# Patient Record
Sex: Female | Born: 1970 | Race: White | Hispanic: No | Marital: Single | Smoking: Never smoker
Health system: Southern US, Community
[De-identification: ages and names within clinical notes are randomized; demographics above are authoritative.]

## PROBLEM LIST (undated history)

## (undated) DIAGNOSIS — J020 Streptococcal pharyngitis: Secondary | ICD-10-CM

## (undated) DIAGNOSIS — K219 Gastro-esophageal reflux disease without esophagitis: Secondary | ICD-10-CM

## (undated) HISTORY — PX: TUBAL LIGATION: SHX77

## (undated) HISTORY — PX: APPENDECTOMY: SHX54

---

## 1998-04-18 ENCOUNTER — Emergency Department (HOSPITAL_COMMUNITY): Admission: EM | Admit: 1998-04-18 | Discharge: 1998-04-18 | Payer: Self-pay | Admitting: Emergency Medicine

## 1999-11-13 ENCOUNTER — Emergency Department (HOSPITAL_COMMUNITY): Admission: EM | Admit: 1999-11-13 | Discharge: 1999-11-13 | Payer: Self-pay | Admitting: Emergency Medicine

## 2001-05-27 ENCOUNTER — Emergency Department (HOSPITAL_COMMUNITY): Admission: EM | Admit: 2001-05-27 | Discharge: 2001-05-27 | Payer: Self-pay

## 2001-08-09 ENCOUNTER — Emergency Department (HOSPITAL_COMMUNITY): Admission: EM | Admit: 2001-08-09 | Discharge: 2001-08-09 | Payer: Self-pay | Admitting: Emergency Medicine

## 2003-07-27 ENCOUNTER — Emergency Department (HOSPITAL_COMMUNITY): Admission: EM | Admit: 2003-07-27 | Discharge: 2003-07-27 | Payer: Self-pay | Admitting: Emergency Medicine

## 2003-07-29 ENCOUNTER — Emergency Department (HOSPITAL_COMMUNITY): Admission: AD | Admit: 2003-07-29 | Discharge: 2003-07-29 | Payer: Self-pay | Admitting: Family Medicine

## 2003-09-21 ENCOUNTER — Emergency Department (HOSPITAL_COMMUNITY): Admission: AD | Admit: 2003-09-21 | Discharge: 2003-09-21 | Payer: Self-pay | Admitting: Family Medicine

## 2006-03-02 ENCOUNTER — Emergency Department (HOSPITAL_COMMUNITY): Admission: EM | Admit: 2006-03-02 | Discharge: 2006-03-02 | Payer: Self-pay | Admitting: Family Medicine

## 2006-09-20 ENCOUNTER — Emergency Department (HOSPITAL_COMMUNITY): Admission: EM | Admit: 2006-09-20 | Discharge: 2006-09-20 | Payer: Self-pay | Admitting: Emergency Medicine

## 2006-12-02 ENCOUNTER — Emergency Department (HOSPITAL_COMMUNITY): Admission: EM | Admit: 2006-12-02 | Discharge: 2006-12-02 | Payer: Self-pay | Admitting: Emergency Medicine

## 2008-06-14 ENCOUNTER — Emergency Department (HOSPITAL_COMMUNITY): Admission: EM | Admit: 2008-06-14 | Discharge: 2008-06-14 | Payer: Self-pay | Admitting: Emergency Medicine

## 2008-09-03 ENCOUNTER — Emergency Department (HOSPITAL_COMMUNITY): Admission: EM | Admit: 2008-09-03 | Discharge: 2008-09-03 | Payer: Self-pay | Admitting: Emergency Medicine

## 2009-02-01 ENCOUNTER — Emergency Department (HOSPITAL_COMMUNITY): Admission: EM | Admit: 2009-02-01 | Discharge: 2009-02-01 | Payer: Self-pay | Admitting: Emergency Medicine

## 2009-04-03 ENCOUNTER — Emergency Department (HOSPITAL_COMMUNITY): Admission: EM | Admit: 2009-04-03 | Discharge: 2009-04-04 | Payer: Self-pay | Admitting: Emergency Medicine

## 2009-07-30 ENCOUNTER — Emergency Department (HOSPITAL_COMMUNITY): Admission: EM | Admit: 2009-07-30 | Discharge: 2009-07-30 | Payer: Self-pay | Admitting: Emergency Medicine

## 2009-10-28 ENCOUNTER — Emergency Department (HOSPITAL_COMMUNITY): Admission: EM | Admit: 2009-10-28 | Discharge: 2009-10-28 | Payer: Self-pay | Admitting: Family Medicine

## 2010-03-11 ENCOUNTER — Emergency Department (HOSPITAL_COMMUNITY): Admission: EM | Admit: 2010-03-11 | Discharge: 2010-03-11 | Payer: Self-pay | Admitting: Emergency Medicine

## 2010-04-10 ENCOUNTER — Emergency Department (HOSPITAL_COMMUNITY): Admission: EM | Admit: 2010-04-10 | Discharge: 2010-04-10 | Payer: Self-pay | Admitting: Family Medicine

## 2010-07-31 ENCOUNTER — Emergency Department (HOSPITAL_COMMUNITY)
Admission: EM | Admit: 2010-07-31 | Discharge: 2010-07-31 | Payer: Self-pay | Source: Home / Self Care | Admitting: Family Medicine

## 2010-08-03 LAB — WET PREP, GENITAL: Yeast Wet Prep HPF POC: NONE SEEN

## 2010-08-03 LAB — POCT URINALYSIS DIPSTICK
Bilirubin Urine: NEGATIVE
Ketones, ur: NEGATIVE mg/dL
Nitrite: NEGATIVE
Protein, ur: 30 mg/dL — AB
Specific Gravity, Urine: 1.02 (ref 1.005–1.030)
Urine Glucose, Fasting: NEGATIVE mg/dL
Urobilinogen, UA: 0.2 mg/dL (ref 0.0–1.0)
pH: 5 (ref 5.0–8.0)

## 2010-08-03 LAB — POCT PREGNANCY, URINE: Preg Test, Ur: NEGATIVE

## 2010-08-05 LAB — GC/CHLAMYDIA PROBE AMP, GENITAL
Chlamydia, DNA Probe: NEGATIVE
GC Probe Amp, Genital: NEGATIVE

## 2010-09-25 ENCOUNTER — Inpatient Hospital Stay (INDEPENDENT_AMBULATORY_CARE_PROVIDER_SITE_OTHER)
Admission: RE | Admit: 2010-09-25 | Discharge: 2010-09-25 | Disposition: A | Discharge status: Home / Self Care | Payer: Self-pay | Source: Ambulatory Visit | Attending: Family Medicine | Admitting: Family Medicine

## 2010-09-25 DIAGNOSIS — T192XXA Foreign body in vulva and vagina, initial encounter: Secondary | ICD-10-CM

## 2010-09-25 DIAGNOSIS — N760 Acute vaginitis: Secondary | ICD-10-CM

## 2010-09-25 LAB — POCT PREGNANCY, URINE: Preg Test, Ur: NEGATIVE

## 2010-09-25 LAB — POCT URINALYSIS DIPSTICK
Bilirubin Urine: NEGATIVE
Glucose, UA: NEGATIVE mg/dL
Hgb urine dipstick: NEGATIVE
Ketones, ur: NEGATIVE mg/dL
Nitrite: NEGATIVE
Protein, ur: NEGATIVE mg/dL
Specific Gravity, Urine: 1.01 (ref 1.005–1.030)
Urobilinogen, UA: 0.2 mg/dL (ref 0.0–1.0)
pH: 5.5 (ref 5.0–8.0)

## 2010-09-25 LAB — WET PREP, GENITAL
Trich, Wet Prep: NONE SEEN
Yeast Wet Prep HPF POC: NONE SEEN

## 2010-09-26 LAB — GC/CHLAMYDIA PROBE AMP, GENITAL
Chlamydia, DNA Probe: NEGATIVE
GC Probe Amp, Genital: NEGATIVE

## 2010-09-30 ENCOUNTER — Encounter: Payer: Self-pay | Admitting: Family Medicine

## 2010-10-01 LAB — POCT URINALYSIS DIPSTICK
Bilirubin Urine: NEGATIVE
Bilirubin Urine: NEGATIVE
Glucose, UA: NEGATIVE mg/dL
Glucose, UA: NEGATIVE mg/dL
Ketones, ur: NEGATIVE mg/dL
Ketones, ur: NEGATIVE mg/dL
Nitrite: NEGATIVE
Nitrite: NEGATIVE
Protein, ur: NEGATIVE mg/dL
Protein, ur: NEGATIVE mg/dL
Specific Gravity, Urine: <=1.005 (ref 1.005–1.030)
Specific Gravity, Urine: >=1.03 (ref 1.005–1.030)
Urobilinogen, UA: 0.2 mg/dL (ref 0.0–1.0)
Urobilinogen, UA: 0.2 mg/dL (ref 0.0–1.0)
pH: 5 (ref 5.0–8.0)
pH: 5 (ref 5.0–8.0)

## 2010-10-01 LAB — GC/CHLAMYDIA PROBE AMP, GENITAL
Chlamydia, DNA Probe: NEGATIVE
Chlamydia, DNA Probe: NEGATIVE
GC Probe Amp, Genital: NEGATIVE
GC Probe Amp, Genital: NEGATIVE

## 2010-10-01 LAB — URINE CULTURE
Colony Count: NO GROWTH
Culture  Setup Time: 201109231319
Culture: NO GROWTH

## 2010-10-01 LAB — WET PREP, GENITAL
Clue Cells Wet Prep HPF POC: NONE SEEN
Trich, Wet Prep: NONE SEEN
Yeast Wet Prep HPF POC: NONE SEEN

## 2010-10-01 LAB — POCT PREGNANCY, URINE: Preg Test, Ur: NEGATIVE

## 2010-10-03 LAB — POCT URINALYSIS DIP (DEVICE)
Bilirubin Urine: NEGATIVE
Glucose, UA: NEGATIVE mg/dL
Ketones, ur: NEGATIVE mg/dL
Nitrite: NEGATIVE
Protein, ur: NEGATIVE mg/dL
Specific Gravity, Urine: 1.01 (ref 1.005–1.030)
Urobilinogen, UA: 1 mg/dL (ref 0.0–1.0)
pH: 6 (ref 5.0–8.0)

## 2010-10-03 LAB — POCT PREGNANCY, URINE: Preg Test, Ur: NEGATIVE

## 2010-10-03 LAB — GC/CHLAMYDIA PROBE AMP, GENITAL
Chlamydia, DNA Probe: NEGATIVE
GC Probe Amp, Genital: NEGATIVE

## 2010-10-03 LAB — WET PREP, GENITAL
Trich, Wet Prep: NONE SEEN
Yeast Wet Prep HPF POC: NONE SEEN

## 2010-10-23 LAB — RPR: RPR Ser Ql: NONREACTIVE

## 2010-10-23 LAB — WET PREP, GENITAL: Yeast Wet Prep HPF POC: NONE SEEN

## 2010-10-23 LAB — URINALYSIS, ROUTINE W REFLEX MICROSCOPIC
Glucose, UA: NEGATIVE mg/dL
Ketones, ur: NEGATIVE mg/dL
Nitrite: NEGATIVE
Protein, ur: NEGATIVE mg/dL
Specific Gravity, Urine: 1.028 (ref 1.005–1.030)
Urobilinogen, UA: 1 mg/dL (ref 0.0–1.0)
pH: 5.5 (ref 5.0–8.0)

## 2010-10-23 LAB — GC/CHLAMYDIA PROBE AMP, GENITAL
Chlamydia, DNA Probe: NEGATIVE
GC Probe Amp, Genital: NEGATIVE

## 2010-10-23 LAB — HIV ANTIBODY (ROUTINE TESTING W REFLEX): HIV: NONREACTIVE

## 2010-10-23 LAB — URINE MICROSCOPIC-ADD ON

## 2010-10-23 LAB — PREGNANCY, URINE: Preg Test, Ur: NEGATIVE

## 2010-10-25 LAB — WET PREP, GENITAL

## 2010-10-25 LAB — GC/CHLAMYDIA PROBE AMP, GENITAL: GC Probe Amp, Genital: NEGATIVE

## 2010-11-03 LAB — POCT RAPID STREP A (OFFICE): Streptococcus, Group A Screen (Direct): POSITIVE — AB

## 2011-03-30 ENCOUNTER — Ambulatory Visit: Payer: Self-pay

## 2011-03-30 ENCOUNTER — Other Ambulatory Visit: Payer: Self-pay | Admitting: Obstetrics and Gynecology

## 2011-03-30 ENCOUNTER — Encounter: Payer: Self-pay | Admitting: Obstetrics and Gynecology

## 2011-03-30 ENCOUNTER — Ambulatory Visit (HOSPITAL_COMMUNITY): Payer: Self-pay

## 2011-03-30 DIAGNOSIS — Z1231 Encounter for screening mammogram for malignant neoplasm of breast: Secondary | ICD-10-CM

## 2011-04-01 ENCOUNTER — Ambulatory Visit (INDEPENDENT_AMBULATORY_CARE_PROVIDER_SITE_OTHER): Payer: Self-pay

## 2011-04-01 ENCOUNTER — Inpatient Hospital Stay (INDEPENDENT_AMBULATORY_CARE_PROVIDER_SITE_OTHER)
Admission: RE | Admit: 2011-04-01 | Discharge: 2011-04-01 | Disposition: A | Discharge status: Home / Self Care | Payer: Self-pay | Source: Ambulatory Visit | Attending: Family Medicine | Admitting: Family Medicine

## 2011-04-01 DIAGNOSIS — M7040 Prepatellar bursitis, unspecified knee: Secondary | ICD-10-CM

## 2011-04-12 ENCOUNTER — Other Ambulatory Visit: Payer: Self-pay | Admitting: Obstetrics and Gynecology

## 2011-04-12 DIAGNOSIS — Z1231 Encounter for screening mammogram for malignant neoplasm of breast: Secondary | ICD-10-CM

## 2011-04-13 ENCOUNTER — Ambulatory Visit: Payer: Self-pay

## 2011-04-13 ENCOUNTER — Ambulatory Visit (HOSPITAL_COMMUNITY)
Admission: RE | Admit: 2011-04-13 | Discharge: 2011-04-13 | Disposition: A | Discharge status: Home / Self Care | Payer: Self-pay | Source: Ambulatory Visit | Attending: Obstetrics and Gynecology | Admitting: Obstetrics and Gynecology

## 2011-04-13 DIAGNOSIS — Z1231 Encounter for screening mammogram for malignant neoplasm of breast: Secondary | ICD-10-CM

## 2011-12-11 ENCOUNTER — Encounter (HOSPITAL_COMMUNITY): Payer: Self-pay | Admitting: *Deleted

## 2011-12-11 ENCOUNTER — Emergency Department (INDEPENDENT_AMBULATORY_CARE_PROVIDER_SITE_OTHER)
Admission: EM | Admit: 2011-12-11 | Discharge: 2011-12-11 | Disposition: A | Discharge status: Home / Self Care | Payer: Self-pay | Source: Home / Self Care | Attending: Emergency Medicine | Admitting: Emergency Medicine

## 2011-12-11 DIAGNOSIS — K219 Gastro-esophageal reflux disease without esophagitis: Secondary | ICD-10-CM

## 2011-12-11 DIAGNOSIS — R0989 Other specified symptoms and signs involving the circulatory and respiratory systems: Secondary | ICD-10-CM

## 2011-12-11 DIAGNOSIS — F458 Other somatoform disorders: Secondary | ICD-10-CM

## 2011-12-11 DIAGNOSIS — J040 Acute laryngitis: Secondary | ICD-10-CM

## 2011-12-11 HISTORY — DX: Gastro-esophageal reflux disease without esophagitis: K21.9

## 2011-12-11 HISTORY — DX: Streptococcal pharyngitis: J02.0

## 2011-12-11 LAB — POCT RAPID STREP A: Streptococcus, Group A Screen (Direct): NEGATIVE

## 2011-12-11 MED ORDER — OMEPRAZOLE 20 MG PO CPDR: 20.0000 mg | DELAYED_RELEASE_CAPSULE | Freq: Every day | ORAL | Status: DC

## 2011-12-11 MED ORDER — FAMOTIDINE 20 MG PO TABS: 20.0000 mg | ORAL_TABLET | Freq: Two times a day (BID) | ORAL | Status: DC

## 2011-12-11 NOTE — ED Provider Notes (Signed)
History     CSN: [id number]  Arrival date & time 12/11/11  1247   First MD Initiated Contact with Patient 12/11/11 1359      Chief Complaint  Patient presents with  . Lymphadenopathy  . Dysphagia    (Consider location/radiation/quality/duration/timing/severity/associated sxs/prior treatment) HPI Comments: Patient reports a sensation of swelling in her anterior throat for the past week. She denies any pain, fevers, drooling, trismus. No ear pain, nasal congestion, sinus pain, postnasal drip. No voice changes, difficulty breathing. States she is able to swallow both solids and liquids, but it is more difficult to swallow solids than normal. No aggravating or alleviating factors. She's not tried anything for her symptoms. No nausea, vomiting, fevers. No dental pain. Facial swelling. no coughing, wheezing, chest pain, shortness of breath. No abdominal pain, chest tightness is similar to previous episodes of reflux. Patient states reflux is not bothering her currently. Patient has never been a smoker.   ROS as noted in HPI. All other ROS negative.   Patient is a 41 y.o. female presenting with pharyngitis. The history is provided by the patient. No language interpreter was used.  Sore Throat This is a new problem. The current episode started more than 1 week ago. The problem occurs constantly. The problem has not changed since onset.Pertinent negatives include no chest pain, no abdominal pain, no headaches and no shortness of breath. The symptoms are aggravated by nothing. The symptoms are relieved by nothing. She has tried nothing for the symptoms. The treatment provided no relief.    Past Medical History  Diagnosis Date  . GERD (gastroesophageal reflux disease)   . Strep pharyngitis     Past Surgical History  Procedure Date  . Appendectomy   . Tubal ligation     History reviewed. No pertinent family history.  History  Substance Use Topics  . Smoking status: Never Smoker   .  Smokeless tobacco: Not on file  . Alcohol Use: Yes    OB History    Grav Para Term Preterm Abortions TAB SAB Ect Mult Living                  Review of Systems  Respiratory: Negative for shortness of breath.   Cardiovascular: Negative for chest pain.  Gastrointestinal: Negative for abdominal pain.  Neurological: Negative for headaches.    Allergies  Review of patient's allergies indicates no known allergies.  Home Medications   Current Outpatient Rx  Name Route Sig Dispense Refill  . FAMOTIDINE 20 MG PO TABS Oral Take 1 tablet (20 mg total) by mouth 2 (two) times daily. 60 tablet 0  . OMEPRAZOLE 20 MG PO CPDR Oral Take 1 capsule (20 mg total) by mouth daily. 30 capsule 0    BP 123/84  Pulse 74  Temp(Src) 96.9 F (36.1 C) (Oral)  Resp 16  SpO2 96%  LMP 12/07/2011  Physical Exam  Nursing note and vitals reviewed. Constitutional: She is oriented to person, place, and time. She appears well-developed and well-nourished. No distress.  HENT:  Head: Normocephalic and atraumatic. No trismus in the jaw.  Right Ear: Tympanic membrane normal.  Left Ear: Tympanic membrane normal.  Nose: Nose normal.  Mouth/Throat: Uvula is midline and mucous membranes are normal. Abnormal dentition. No dental abscesses, uvula swelling or dental caries. No oropharyngeal exudate or tonsillar abscesses.       Poor dentition, no tenderness, gingival swelling. No facial swelling. No palpable salivary stones. Enlarged tonsils, no exudates. No stridor  Eyes: Conjunctivae  and EOM are normal.  Neck: Normal range of motion. Neck supple. Normal range of motion present. No thyromegaly present.  Cardiovascular: Normal rate.   Pulmonary/Chest: Effort normal. No stridor.  Abdominal: She exhibits no distension.  Musculoskeletal: Normal range of motion.  Lymphadenopathy:    She has no cervical adenopathy.  Neurological: She is alert and oriented to person, place, and time.  Skin: Skin is warm and dry.    Psychiatric: She has a normal mood and affect. Her behavior is normal. Judgment and thought content normal.    ED Course  Procedures (including critical care time)   Labs Reviewed  POCT RAPID STREP A ([facility] ONLY)   No results found.   1. Reflux laryngitis   2. Globus sensation    Results for orders placed during the hospital encounter of 12/11/11  POCT RAPID STREP A ([facility] ONLY)      Component Value Range   Streptococcus, Group A Screen (Direct) NEGATIVE  NEGATIVE     MDM   airway widely patent, no drooling, trismus, stridor. Patient with history of reflux,  presentation most consistent with this. Will start her on H2 and PPI. Will have her follow with Dr. Pollyann Kennedy, ENT on call, in a week if she does not improve with this for laryngoscopy.   Luiz Blare, MD 12/11/11 [phone]

## 2011-12-11 NOTE — Discharge Instructions (Signed)
this sensation is most commonly due to reflux. We will start you on a PPI and H2 blocker which will treat reflux. Elevate head of your bed, decreased spicy foods, chocolate, alcohol before going to bed. If this is not get better in a week, you'll need to see ear nose throat specialist, to have her examined with a scope. Return for fever above 100.4, if you cannot breathe,  cannot swallow, for changes, or any other concerns.

## 2011-12-11 NOTE — ED Notes (Signed)
EMT briefly evaluated this pt in waiting area due to c/o swollen throat. Pt reports that she has been having difficulty swallowing (indicated area proximal to epiglottis on throat with her finger) for several days. Pt reports no difficulty breathing and speaks in complete sentences.

## 2011-12-11 NOTE — ED Notes (Signed)
Pt with c/o feeling her throat is swollen and difficulty swallowing x one week - denies cough/congestion/fever or sore throat - able to eat and drink without difficulty

## 2013-11-21 ENCOUNTER — Emergency Department (HOSPITAL_COMMUNITY)
Admission: EM | Admit: 2013-11-21 | Discharge: 2013-11-21 | Disposition: A | Discharge status: Home / Self Care | Payer: Self-pay | Attending: Emergency Medicine | Admitting: Emergency Medicine

## 2013-11-21 ENCOUNTER — Emergency Department (HOSPITAL_COMMUNITY): Payer: Self-pay

## 2013-11-21 ENCOUNTER — Encounter (HOSPITAL_COMMUNITY): Payer: Self-pay | Admitting: Emergency Medicine

## 2013-11-21 DIAGNOSIS — R112 Nausea with vomiting, unspecified: Secondary | ICD-10-CM | POA: Insufficient documentation

## 2013-11-21 DIAGNOSIS — Z3202 Encounter for pregnancy test, result negative: Secondary | ICD-10-CM | POA: Insufficient documentation

## 2013-11-21 DIAGNOSIS — Z79899 Other long term (current) drug therapy: Secondary | ICD-10-CM | POA: Insufficient documentation

## 2013-11-21 DIAGNOSIS — Z9851 Tubal ligation status: Secondary | ICD-10-CM | POA: Insufficient documentation

## 2013-11-21 DIAGNOSIS — N390 Urinary tract infection, site not specified: Secondary | ICD-10-CM | POA: Insufficient documentation

## 2013-11-21 DIAGNOSIS — Z9889 Other specified postprocedural states: Secondary | ICD-10-CM | POA: Insufficient documentation

## 2013-11-21 DIAGNOSIS — K219 Gastro-esophageal reflux disease without esophagitis: Secondary | ICD-10-CM | POA: Insufficient documentation

## 2013-11-21 DIAGNOSIS — Z8619 Personal history of other infectious and parasitic diseases: Secondary | ICD-10-CM | POA: Insufficient documentation

## 2013-11-21 LAB — CBC
HCT: 40.2 % (ref 36.0–46.0)
Hemoglobin: 13.7 g/dL (ref 12.0–15.0)
MCH: 30 pg (ref 26.0–34.0)
MCHC: 34.1 g/dL (ref 30.0–36.0)
MCV: 88 fL (ref 78.0–100.0)
Platelets: 276 10*3/uL (ref 150–400)
RBC: 4.57 MIL/uL (ref 3.87–5.11)
RDW: 13.4 % (ref 11.5–15.5)
WBC: 8.6 10*3/uL (ref 4.0–10.5)

## 2013-11-21 LAB — URINALYSIS, ROUTINE W REFLEX MICROSCOPIC
Bilirubin Urine: NEGATIVE
Glucose, UA: NEGATIVE mg/dL
Ketones, ur: >80 mg/dL — AB
Nitrite: POSITIVE — AB
Protein, ur: 30 mg/dL — AB
Specific Gravity, Urine: 1.029 (ref 1.005–1.030)
Urobilinogen, UA: 0.2 mg/dL (ref 0.0–1.0)
pH: 5 (ref 5.0–8.0)

## 2013-11-21 LAB — COMPREHENSIVE METABOLIC PANEL WITH GFR
ALT: 11 U/L (ref 0–35)
AST: 14 U/L (ref 0–37)
Albumin: 4.2 g/dL (ref 3.5–5.2)
Alkaline Phosphatase: 85 U/L (ref 39–117)
BUN: 9 mg/dL (ref 6–23)
CO2: 25 meq/L (ref 19–32)
Calcium: 9.7 mg/dL (ref 8.4–10.5)
Chloride: 105 meq/L (ref 96–112)
Creatinine, Ser: 0.65 mg/dL (ref 0.50–1.10)
GFR calc Af Amer: >90 mL/min
GFR calc non Af Amer: >90 mL/min
Glucose, Bld: 116 mg/dL — ABNORMAL HIGH (ref 70–99)
Potassium: 4.5 meq/L (ref 3.7–5.3)
Sodium: 142 meq/L (ref 137–147)
Total Bilirubin: 0.5 mg/dL (ref 0.3–1.2)
Total Protein: 8.4 g/dL — ABNORMAL HIGH (ref 6.0–8.3)

## 2013-11-21 LAB — URINE MICROSCOPIC-ADD ON

## 2013-11-21 LAB — I-STAT TROPONIN, ED: Troponin i, poc: 0.02 ng/mL (ref 0.00–0.08)

## 2013-11-21 LAB — D-DIMER, QUANTITATIVE: D-Dimer, Quant: 0.31 ug/mL-FEU (ref 0.00–0.48)

## 2013-11-21 LAB — POC URINE PREG, ED: Preg Test, Ur: NEGATIVE

## 2013-11-21 LAB — TROPONIN I

## 2013-11-21 LAB — LIPASE, BLOOD: Lipase: 30 U/L (ref 11–59)

## 2013-11-21 MED ORDER — ONDANSETRON 4 MG PO TBDP
4.0000 mg | ORAL_TABLET | Freq: Once | ORAL | Status: AC
  Administered 2013-11-21: 4 mg via ORAL
  Filled 2013-11-21: qty 1

## 2013-11-21 MED ORDER — OMEPRAZOLE 20 MG PO CPDR: 20.0000 mg | DELAYED_RELEASE_CAPSULE | Freq: Every day | ORAL | Status: DC

## 2013-11-21 MED ORDER — SODIUM CHLORIDE 0.9 % IV BOLUS (SEPSIS)
1000.0000 mL | Freq: Once | INTRAVENOUS | Status: AC
  Administered 2013-11-21: 1000 mL via INTRAVENOUS

## 2013-11-21 MED ORDER — DEXTROSE 5 % IV SOLN
1.0000 g | Freq: Once | INTRAVENOUS | Status: AC
  Administered 2013-11-21: 1 g via INTRAVENOUS
  Filled 2013-11-21: qty 10

## 2013-11-21 MED ORDER — GI COCKTAIL ~~LOC~~
30.0000 mL | Freq: Once | ORAL | Status: AC
  Administered 2013-11-21: 30 mL via ORAL
  Filled 2013-11-21: qty 30

## 2013-11-21 MED ORDER — ONDANSETRON HCL 4 MG PO TABS: 4.0000 mg | ORAL_TABLET | Freq: Four times a day (QID) | ORAL | Status: DC

## 2013-11-21 MED ORDER — OXYCODONE-ACETAMINOPHEN 5-325 MG PO TABS
1.0000 | ORAL_TABLET | Freq: Once | ORAL | Status: AC
  Administered 2013-11-21: 1 via ORAL
  Filled 2013-11-21: qty 1

## 2013-11-21 MED ORDER — CEPHALEXIN 500 MG PO CAPS: 500.0000 mg | ORAL_CAPSULE | Freq: Two times a day (BID) | ORAL | Status: DC

## 2013-11-21 MED ORDER — FAMOTIDINE 20 MG PO TABS: 20.0000 mg | ORAL_TABLET | Freq: Two times a day (BID) | ORAL | Status: DC

## 2013-11-21 NOTE — ED Notes (Signed)
Pt did not answer when called for reassessment of vitals.

## 2013-11-21 NOTE — Discharge Instructions (Signed)
Please call your doctor for a followup appointment within 24-48 hours. When you talk to your doctor please let them know that you were seen in the emergency department and have them acquire all of your records so that they can discuss the findings with you and formulate a treatment plan to fully care for your new and ongoing problems. Please call and set up an appointment with health and wellness Center Please call and set up an appointment with gastroenterologist regarding word symptoms Please rest and stay hydrated-please drink plenty of water Please stay away from foods high in fat, greasy, spicy, acidity for this can worsen symptoms of GERD Please take medications as prescribed Please take Keflex as prescribed for urinary tract infection Please continue to monitor symptoms closely if symptoms are to worsen or change (fever greater than 101, chills, chest pain, shortness of breath, difficulty breathing, neck pain, neck stiffness, abdominal pain, nausea, vomiting, diarrhea, blood in the stools, black tarry stools, worsening symptoms) please report back to the ED immediately  Urinary Tract Infection Urinary tract infections (UTIs) can develop anywhere along your urinary tract. Your urinary tract is your body's drainage system for removing wastes and extra water. Your urinary tract includes two kidneys, two ureters, a bladder, and a urethra. Your kidneys are a pair of bean-shaped organs. Each kidney is about the size of your fist. They are located below your ribs, one on each side of your spine. CAUSES Infections are caused by microbes, which are microscopic organisms, including fungi, viruses, and bacteria. These organisms are so small that they can only be seen through a microscope. Bacteria are the microbes that most commonly cause UTIs. SYMPTOMS  Symptoms of UTIs may vary by age and gender of the patient and by the location of the infection. Symptoms in young women typically include a frequent and  intense urge to urinate and a painful, burning feeling in the bladder or urethra during urination. Older women and men are more likely to be tired, shaky, and weak and have muscle aches and abdominal pain. A fever may mean the infection is in your kidneys. Other symptoms of a kidney infection include pain in your back or sides below the ribs, nausea, and vomiting. DIAGNOSIS To diagnose a UTI, your caregiver will ask you about your symptoms. Your caregiver also will ask to provide a urine sample. The urine sample will be tested for bacteria and white blood cells. White blood cells are made by your body to help fight infection. TREATMENT  Typically, UTIs can be treated with medication. Because most UTIs are caused by a bacterial infection, they usually can be treated with the use of antibiotics. The choice of antibiotic and length of treatment depend on your symptoms and the type of bacteria causing your infection. HOME CARE INSTRUCTIONS  If you were prescribed antibiotics, take them exactly as your caregiver instructs you. Finish the medication even if you feel better after you have only taken some of the medication.  Drink enough water and fluids to keep your urine clear or pale yellow.  Avoid caffeine, tea, and carbonated beverages. They tend to irritate your bladder.  Empty your bladder often. Avoid holding urine for long periods of time.  Empty your bladder before and after sexual intercourse.  After a bowel movement, women should cleanse from front to back. Use each tissue only once. SEEK MEDICAL CARE IF:   You have back pain.  You develop a fever.  Your symptoms do not begin to resolve within  3 days. SEEK IMMEDIATE MEDICAL CARE IF:   You have severe back pain or lower abdominal pain.  You develop chills.  You have nausea or vomiting.  You have continued burning or discomfort with urination. MAKE SURE YOU:   Understand these instructions.  Will watch your condition.  Will  get help right away if you are not doing well or get worse. Document Released: 04/14/2005 Document Revised: 01/04/2012 Document Reviewed: 08/13/2011 [facility] Patient Information 2014 [place]Care, [place].  Diet for Gastroesophageal Reflux Disease, Adult Reflux (acid reflux) is when acid from your stomach flows up into the esophagus. When acid comes in contact with the esophagus, the acid causes irritation and soreness (inflammation) in the esophagus. When reflux happens often or so severely that it causes damage to the esophagus, it is called gastroesophageal reflux disease (GERD). Nutrition therapy can help ease the discomfort of GERD. FOODS OR DRINKS TO AVOID OR LIMIT  Smoking or chewing tobacco. Nicotine is one of the most potent stimulants to acid production in the gastrointestinal tract.  Caffeinated and decaffeinated coffee and black tea.  Regular or low-calorie carbonated beverages or energy drinks (caffeine-free carbonated beverages are allowed).   Strong spices, such as black pepper, white pepper, red pepper, cayenne, curry powder, and chili powder.  Peppermint or spearmint.  Chocolate.  High-fat foods, including meats and fried foods. Extra added fats including oils, butter, salad dressings, and nuts. Limit these to less than 8 tsp per day.  Fruits and vegetables if they are not tolerated, such as citrus fruits or tomatoes.  Alcohol.  Any food that seems to aggravate your condition. If you have questions regarding your diet, call your caregiver or a registered dietitian. OTHER THINGS THAT MAY HELP GERD INCLUDE:   Eating your meals slowly, in a relaxed setting.  Eating 5 to 6 small meals per day instead of 3 large meals.  Eliminating food for a period of time if it causes distress.  Not lying down until 3 hours after eating a meal.  Keeping the head of your bed raised 6 to 9 inches (15 to 23 cm) by using a foam wedge or blocks under the legs of the bed. Lying flat may make  symptoms worse.  Being physically active. Weight loss may be helpful in reducing reflux in overweight or obese adults.  Wear loose fitting clothing EXAMPLE MEAL PLAN This meal plan is approximately 2,000 calories based on https://www.bernard.org/ChooseMyPlate.gov meal planning guidelines. Breakfast   cup cooked oatmeal.  1 cup strawberries.  1 cup low-fat milk.  1 oz almonds. Snack  1 cup cucumber slices.  6 oz yogurt (made from low-fat or fat-free milk). Lunch  2 slice whole-wheat bread.  2 oz sliced [place]urkey.  2 tsp mayonnaise.  1 cup blueberries.  1 cup snap peas. Snack  6 whole-wheat crackers.  1 oz string cheese. Dinner   cup brown rice.  1 cup mixed veggies.  1 tsp olive oil.  3 oz grilled fish. Document Released: 07/05/2005 Document Revised: 09/27/2011 Document Reviewed: 05/21/2011 [facility] TexasExitCare Patient Information 2014 [place] PassExitCare, [place]LLC.   Emergency Department Resource Guide 1) Find a Doctor and Pay Out of Pocket Although you won't have to find out who is covered by your insurance plan, it is a good idea to ask around and get recommendations. You will then need to call the office and see if the doctor you have chosen will accept you as a new patient and what types of options they offer for patients who are self-pay. Some doctors  offer discounts or will set up payment plans for their patients who do not have insurance, but you will need to ask so you aren't surprised when you get to your appointment.  2) Contact Your Local Health Department Not all health departments have doctors that can see patients for sick visits, but many do, so it is worth a call to see if yours does. If you don't know where your local health department is, you can check in your phone book. The CDC also has a tool to help you locate your state's health department, and many state websites also have listings of all of their local health departments.  3) Find a Walk-in Clinic If your illness is not likely to be  very severe or complicated, you may want to try a walk in clinic. These are popping up all over the country in pharmacies, drugstores, and shopping centers. They're usually staffed by nurse practitioners or physician assistants that have been trained to treat common illnesses and complaints. They're usually fairly quick and inexpensive. However, if you have serious medical issues or chronic medical problems, these are probably not your best option.  No Primary Care Doctor: - Call Health Connect at  [phone] - they can help you locate a primary care doctor that  accepts your insurance, provides certain services, etc. - Physician Referral Service- [phone]  Chronic Pain Problems: Organization         Address  Phone   Notes  [facility]  [phone] Patients need to be referred by their primary care doctor.   Medication Assistance: Organization         Address  Phone   Notes  [facility] Medication [facility] [address] [place] [place], [place] [address] [phone] --Must be a resident of [facility] -- Must have NO insurance coverage whatsoever (no Medicaid/ Medicare, etc.) -- The pt. MUST have a primary care doctor that directs their care regularly and follows them in the community   [facility]ist  [phone]   [employer]  [phone]    Agencies that provide inexpensive medical care: Organization         Address  Phone   Notes  [facility]  [phone]   [facility]    [phone]   [facility] [address] [place], [place] [address] [phone]   [facility] [address] [place] [place] [phone]   Planned Parenthood    [phone]   [facility]    [phone]   [facility]  [address], [place] Phone:  [phone], Fax:  [phone] Hours of Operation:  9 am - 6 pm, M-F.  Also  accepts Medicaid/Medicare and self-pay.  [facility]  [address], [place] Phone: [phone], Fax: [phone]. Hours of Operation:  8:30 am - 5:30 pm, M-F.  Also accepts Medicaid and self-pay.  [facility] [place] [address], [place] Phone: [phone]   [facility] [address] Natasha Bence [place], [place] [phone], Ext. [phone] Mondays & Thursdays: 7-9 AM.  First 15 patients are seen on a first come, first serve basis.    Medicaid-accepting [facility] Providers:  Organization         Address  Phone   Notes  [facility] [address], Laurell Josephs  A, [place] [phone] Also accepts self-pay patients.  [facility] [address], [place]  [phone]   [facility] [address], [place] [phone]   [facility] [address] [place] [address], [place] [phone]) [phone]   Renaye RakersVeita Bland [address], [place]   [phone] Only accepts [place] Access [place]icaid patients after they have their name applied to their card.   Self-Pay (no insurance) in [facility]:  Organization         Address  Phone   Notes  Sickle Cell Patients, [facility] [address], [place] [phone](336) [phone]   [facility] [address], [place] [phone](336) [phone]   [facility] [place]  [address], [place] [phone]   [facility]/Dr. Osei-Bonsu  [address] [place] [address], [place] or [phone], [place] [phone] Phone number for both [place] and [place] locations is the same.  [facility] and [facility] [address], [place] [phone]   [facility] [place] [address] [place] [address], [place] or [address] [phone]   [facility] [address],  [place] [phone](336) [phone], phone; [phone], fax Sees patients 1st and 3rd Saturday of every month.  Must not qualify for public or private insurance (i.e. Medicaid, Medicare, [place] Health Choice, Veterans' Benefits)  Household income should be no more than 200% of the poverty level The clinic cannot treat you if you are pregnant or think you are pregnant  Sexually transmitted diseases are not treated at the clinic.    Dental Care: Organization         Address  Phone  Notes  [facility] [address], [place] [phone] Accepts children up to age 44 who are enrolled in [place]icaid or [place] Health Choice; pregnant women with a Medicaid card; and children who have applied for Medicaid or [place] Health Choice, but were declined, whose parents can pay a reduced fee at time of service.  [facility] of [facility] [place]  [address], [place] [phone] Accepts children up to age 44 who are enrolled in [place]icaid or [address] Health Choice; pregnant women with a Medicaid card; and children who have applied for Medicaid or [place] Health Choice, but were declined, whose parents can pay a reduced fee at time of service.  [facility]  [address], [place] [phone]) [phone] Patients are seen by appointment only. Walk-ins are not accepted. [facility] will see patients 44 years of age and older. Monday - Tuesday (8am-5pm) Most Wednesdays (8:30-5pm) $30 per visit, cash only  [facility]  [address], [facility] [phone]) [phone] Patients are seen by appointment only. Walk-ins are not accepted. [facility] will see patients 44 years of age and older. One Wednesday Evening (Monthly: Volunteer Based).  $30 per visit, cash only  [employer] [facility]  [phone] for adults; Children under age 614, call Graduate Pediatric Dentistry at [phone].  Children aged 624-14, please call [phone] to request a pediatric application.  Dental services are provided in all areas of dental care including fillings, crowns and bridges, complete and partial dentures, implants, gum treatment, root canals, and extractions. Preventive care  is also provided. Treatment is provided to both adults and children. Patients are selected via a lottery and there is often a waiting list.   [facility] [address], [place]  [phone]) [phone] [email or web address]   [facility] [address], [place], [place] [phone], Ext. [phone] Second and Fourth Thursday of each month, opens at 6:30 AM; Clinic ends at 9 AM.  Patients are seen on a first-come first-served basis, and a limited number are seen during each clinic.   [facility]  [address], [place], [place] [phone]) [phone]   Eligibility Requirements You must have lived in [place], [place], or [place] counties for at least the last three months.   You cannot be eligible for state or federal sponsored National Cityhealthcare insurance, including CIGNAVeterans Administration, [place]Medicaid, or Har[employer] EntertainmentMedicare.   You generally cannot be eligible for healthcare insurance through your employer.    How to apply: Eligibility screenings are held every Tuesday and Wednesday afternoon from 1:00 pm until 4:00 pm. You do not need an appointment for the interview!  [facility] [address], [place], [place] [phone]   [facility]  [phone]   [facility]  [phone]   [facility]  [phone]    Behavioral Health Resources in the Community: Intensive Outpatient Programs Organization         Address  Phone  Notes  [facility] [address], [place], [place] [phone]   [facility] [address], [place], [place] [phone]   [facility] [address], [place], [place]  [phone]   [facility] [address],  [place], [place] [phone] or [phone]   Substance Abuse Resources Organization         Address  Phone  Notes  [facility] Drug [facility]  [phone]   [facility]  [phone]   [facility]  [phone]   [facility]  [phone]   Residential & Outpatient Substance Abuse Program  [phone]   Psychological Services Organization         Address  Phone  Notes  [facility]  [phone]- [phone]   [facility]  [phone]- [phone]   [facility] [address], [place] [phone] or [phone]    Mobile Crisis Teams Organization         Address  Phone  Notes  [facility], Mobile Crisis Care Unit  [phone]   [facility]  [address] [place], [place] [phone]   Doristine LocksSharon DeEsch [address] [place] [phone]    Self-Help/Support Groups Organization         Address  Phone             Notes  [facility] [place] - variety of support groups  336- [phone] Call for more information  Narcotics Anonymous (NA), [facility] [address], [place]  2 meetings at this location   Statisticianesidential Treatment Programs Organization         Address  Phone  Notes  [facility] [address],    [place]  [phone]   [facility]  [address], [place] [phone], [place], [place] [phone]   [facility] [address], [place] [phone] Admissions: 8am-3pm M-F  [facility] [address]  [place], [place] [phone]   [facility] [address] Starling Manns [place], [place] [phone]   [facility] [address]  [place], [place] [phone]   [facility] - Intensive Outpatient [address], [place], [place] [phone]     [facility] ([facility] [address] [place].,  [place], [place] [phone] or [phone]   [facility] ([facility]) [address], [place], [place] [phone] Accepts Medicaid  [facility] [address],  [place] [phone] Substance Abuse/Addiction Treatment   [facility] Organization         Address  Phone  Notes  [facility]  [phone]   Angie Fava, PhD [address] Ervin Knack [place], [place]   [phone] or [phone]   [facility]   [address] [place], [place] [phone]   [facility] [address] [place], [place] [phone] Insurance/Medicaid/sponsorship through [facility] [address]                                    [place], [place] [phone] Therapy/tele-psych/case  [facility] [address], [place] [phone]    Dr. Lolly Mustache  [phone]   [facility] [place]  [employer] [facility] Dept. 1) [address], [place] 2) [address], [place] 3)  [address], [place] [phone]  [phone]   [facility] Child Abuse Hotline [phone] or [phone] (After Hours)

## 2013-11-21 NOTE — ED Notes (Signed)
Pt tolerating oral fluids and cracker. 

## 2013-11-21 NOTE — ED Provider Notes (Signed)
CSN: [id number]     Arrival date & time 11/21/13  1243 History   First MD Initiated Contact with Patient 11/21/13 1624     Chief Complaint  Patient presents with  . Chest Pain     (Consider location/radiation/quality/duration/timing/severity/associated sxs/prior Treatment) The history is provided by the patient. No language interpreter was used.  Danielle Frost is a 43 year old female with past history of GERD, streptococcal pharyngitis presenting to the ED with chest pain that started this morning with the sudden onset at 2:00 AM that woke patient up out of a sound sleep. Reported that the pain is localized to the center the chest described as a constant pressure, squeezing sensation with mild radiation towards the back. Patient reports that she's taken nothing for the pain. Reported that she's been feeling extremely nauseous with numerous episodes of vomiting-reported that she cannot recall exactly way how many episodes of emesis she has had today. Reported that she is unable to keep any food or fluids down. Reported these 3 bowel movements today-normal. Patient reported that she ate french fries and a hamburger yesterday. Denied weakness, neck pain, abdominal pain, diarrhea, melena, hematochezia, fainting, blurred vision, sudden loss of vision. Denied cigarette and alcohol use. PCP negative  Past Medical History  Diagnosis Date  . GERD (gastroesophageal reflux disease)   . Strep pharyngitis    Past Surgical History  Procedure Laterality Date  . Appendectomy    . Tubal ligation     History reviewed. No pertinent family history. History  Substance Use Topics  . Smoking status: Never Smoker   . Smokeless tobacco: Not on file  . Alcohol Use: Yes   OB History   Grav Para Term Preterm Abortions TAB SAB Ect Mult Living                 Review of Systems  Constitutional: Negative for fever and chills.  Respiratory: Negative for cough, chest tightness and shortness of breath.    Cardiovascular: Positive for chest pain.  Gastrointestinal: Positive for nausea, vomiting and abdominal pain (epigastric). Negative for diarrhea, constipation, blood in stool and anal bleeding.  Musculoskeletal: Positive for back pain. Negative for neck pain.  Neurological: Negative for weakness and headaches.  All other systems reviewed and are negative.     Allergies  Review of patient's allergies indicates no known allergies.  Home Medications   Prior to Admission medications   Medication Sig Start Date End Date Taking? Authorizing Provider  famotidine (PEPCID) 20 MG tablet Take 1 tablet (20 mg total) by mouth 2 (two) times daily. 12/11/11 12/10/12  Domenick Gong, MD  omeprazole (PRILOSEC) 20 MG capsule Take 1 capsule (20 mg total) by mouth daily. 12/11/11 12/10/12  Domenick Gong, MD   BP 142/98  Pulse 84  Temp(Src) 98.4 F (36.9 C) (Oral)  Resp 18  SpO2 99%  LMP 10/31/2013 Physical Exam  Nursing note and vitals reviewed. Constitutional: She is oriented to person, place, and time. She appears well-developed and well-nourished. No distress.  HENT:  Head: Normocephalic and atraumatic.  Mouth/Throat: Oropharynx is clear and moist. No oropharyngeal exudate.  Eyes: Conjunctivae and EOM are normal. Pupils are equal, round, and reactive to light. Right eye exhibits no discharge. Left eye exhibits no discharge.  Neck: Normal range of motion. Neck supple. No tracheal deviation present.  Negative neck stiffness Negative nuchal rigidity Negative cervical lymphadenopathy  Cardiovascular: Normal rate, regular rhythm and normal heart sounds.  Exam reveals no friction rub.   No murmur heard.  Cap refill less than 3 seconds Negative swelling or pitting edema identified to lower extremities Negative Homans sign bilaterally  Pulmonary/Chest: Effort normal and breath sounds normal. No respiratory distress. She has no wheezes. She has no rales.  Patient is able to speak in full sentences  without difficulty It negative use of accessory muscles Negative stridor  Abdominal: Soft. Bowel sounds are normal. She exhibits no distension. There is tenderness. There is no rebound and no guarding.  Mild discomfort upon palpation to the epigastric region Negative RUQ tenderness  Musculoskeletal: Normal range of motion.  Full ROM to upper and lower extremities without difficulty noted, negative ataxia noted.  Lymphadenopathy:    She has no cervical adenopathy.  Neurological: She is alert and oriented to person, place, and time. No cranial nerve deficit. She exhibits normal muscle tone. Coordination normal.  Skin: Skin is warm and dry. No rash noted. She is not diaphoretic. No erythema.  Psychiatric: She has a normal mood and affect. Her behavior is normal. Thought content normal.    ED Course  Procedures (including critical care time)  8:34 PM Patient reported that pain is 0/10. Fluid challenge, patient tolerated well. Negative episodes of emesis while in ED setting.  Results for orders placed during the hospital encounter of 11/21/13  CBC      Result Value Ref Range   WBC 8.6  4.0 - 10.5 K/uL   RBC 4.57  3.87 - 5.11 MIL/uL   Hemoglobin 13.7  12.0 - 15.0 g/dL   HCT 16.140.2  09.636.0 - 04.546.0 %   MCV 88.0  78.0 - 100.0 fL   MCH 30.0  26.0 - 34.0 pg   MCHC 34.1  30.0 - 36.0 g/dL   RDW 40.913.4  81.111.5 - 91.415.5 %   Platelets 276  150 - 400 K/uL  COMPREHENSIVE METABOLIC PANEL      Result Value Ref Range   Sodium 142  137 - 147 mEq/L   Potassium 4.5  3.7 - 5.3 mEq/L   Chloride 105  96 - 112 mEq/L   CO2 25  19 - 32 mEq/L   Glucose, Bld 116 (*) 70 - 99 mg/dL   BUN 9  6 - 23 mg/dL   Creatinine, Ser 7.820.65  0.50 - 1.10 mg/dL   Calcium 9.7  8.4 - 95.610.5 mg/dL   Total Protein 8.4 (*) 6.0 - 8.3 g/dL   Albumin 4.2  3.5 - 5.2 g/dL   AST 14  0 - 37 U/L   ALT 11  0 - 35 U/L   Alkaline Phosphatase 85  39 - 117 U/L   Total Bilirubin 0.5  0.3 - 1.2 mg/dL   GFR calc non Af Amer >90  >90 mL/min   GFR calc  Af Amer >90  >90 mL/min  LIPASE, BLOOD      Result Value Ref Range   Lipase 30  11 - 59 U/L  URINALYSIS, ROUTINE W REFLEX MICROSCOPIC      Result Value Ref Range   Color, Urine YELLOW  YELLOW   APPearance CLOUDY (*) CLEAR   Specific Gravity, Urine 1.029  1.005 - 1.030   pH 5.0  5.0 - 8.0   Glucose, UA NEGATIVE  NEGATIVE mg/dL   Hgb urine dipstick MODERATE (*) NEGATIVE   Bilirubin Urine NEGATIVE  NEGATIVE   Ketones, ur >80 (*) NEGATIVE mg/dL   Protein, ur 30 (*) NEGATIVE mg/dL   Urobilinogen, UA 0.2  0.0 - 1.0 mg/dL   Nitrite POSITIVE (*) NEGATIVE  Leukocytes, UA MODERATE (*) NEGATIVE  TROPONIN I      Result Value Ref Range   Troponin I <0.30  <0.30 ng/mL  D-DIMER, QUANTITATIVE      Result Value Ref Range   D-Dimer, Quant 0.31  0.00 - 0.48 ug/mL-FEU  URINE MICROSCOPIC-ADD ON      Result Value Ref Range   Squamous Epithelial / LPF FEW (*) RARE   WBC, UA 21-50  <3 WBC/hpf   RBC / HPF 7-10  <3 RBC/hpf   Bacteria, UA FEW (*) RARE   Urine-Other AMORPHOUS URATES/PHOSPHATES    I-STAT TROPOININ, ED      Result Value Ref Range   Troponin i, poc 0.02  0.00 - 0.08 ng/mL   Comment 3           POC URINE PREG, ED      Result Value Ref Range   Preg Test, Ur NEGATIVE  NEGATIVE    Labs Review Labs Reviewed  COMPREHENSIVE METABOLIC PANEL - Abnormal; Notable for the following:    Glucose, Bld 116 (*)    Total Protein 8.4 (*)    All other components within normal limits  URINALYSIS, ROUTINE W REFLEX MICROSCOPIC - Abnormal; Notable for the following:    APPearance CLOUDY (*)    Hgb urine dipstick MODERATE (*)    Ketones, ur >80 (*)    Protein, ur 30 (*)    Nitrite POSITIVE (*)    Leukocytes, UA MODERATE (*)    All other components within normal limits  URINE MICROSCOPIC-ADD ON - Abnormal; Notable for the following:    Squamous Epithelial / LPF FEW (*)    Bacteria, UA FEW (*)    All other components within normal limits  URINE CULTURE  CBC  LIPASE, BLOOD  TROPONIN I  D-DIMER,  QUANTITATIVE  I-STAT TROPOININ, ED  POC URINE PREG, ED    Imaging Review Dg Chest 2 View  11/21/2013   CLINICAL DATA:  Chest pain.  EXAM: CHEST  2 VIEW  COMPARISON:  None.  FINDINGS: The lungs are clear. Heart size is normal. There is no pneumothorax or pleural effusion.  IMPRESSION: Negative chest.   Electronically Signed   By: Drusilla Kannerhomas  Dalessio M.D.   On: 11/21/2013 13:40     EKG Interpretation   Date/Time:  Wednesday Nov 21 2013 12:46:33 EDT Ventricular Rate:  67 PR Interval:  136 QRS Duration: 86 QT Interval:  360 QTC Calculation: 380 R Axis:   61 Text Interpretation:  Normal sinus rhythm Normal ECG Confirmed by POLLINA   MD, CHRISTOPHER [phone]([id number]) on 11/21/2013 5:42:45 PM      MDM   Final diagnoses:  GERD (gastroesophageal reflux disease)  UTI (urinary tract infection)   Medications  oxyCODONE-acetaminophen (PERCOCET/ROXICET) 5-325 MG per tablet 1 tablet (1 tablet Oral Given 11/21/13 1550)  sodium chloride 0.9 % bolus 1,000 mL (0 mLs Intravenous Stopped 11/21/13 1842)  ondansetron (ZOFRAN-ODT) disintegrating tablet 4 mg (4 mg Oral Given 11/21/13 1715)  gi cocktail (Maalox,Lidocaine,Donnatal) (30 mLs Oral Given 11/21/13 1847)  cefTRIAXone (ROCEPHIN) 1 g in dextrose 5 % 50 mL IVPB (0 g Intravenous Stopped 11/21/13 2016)   Filed Vitals:   11/21/13 1900 11/21/13 1930 11/21/13 2000 11/21/13 2030  BP: 146/81 151/84 137/87 142/98  Pulse: 62 61 69 84  Temp:      TempSrc:      Resp: 18 21 24 18   SpO2: 100% 99% 98% 99%   EKG normal sinus rhythm with a heart rate of 67 beats  per minute. Troponin negative elevation. D-dimer negative elevation. CBC negative elevated white blood cell count. CMP negative findings-kidney and liver functioning well. Lipase negative elevation. Chest x-ray unremarkable. Urine pregnancy negative. Urine pregnancy negative. Urinalysis noted moderate hemoglobin with positive nitrites and moderate leukocytes with white blood cell count 21-50. Chest x-ray negative acute  cardiopulmonary disease identified. Doubt cholelithiasis/cholangitis. Doubt pancreatitis. Doubt acute abdominal processes. Doubt appendicitis. HEART score 1. D-dimer negative elevation doubt PE or DVT. Suspicion to be GERD. Incidental urinary tract infection identified. Patient given IV fluids, GI cocktail and IV antibiotics. Patient seen and assessed by attending physician, Dr. Earna Coder - agreed to plan of discharge. Abdominal exam unremarkable - benign abdominal exam. Patient stable, afebrile. Negative signs of respiratory distress. Patient tolerated fluids by mouth without difficulty-negative episodes of emesis while in ED setting. Discharged patient. Discharged patient with Zofran, Pepcid, omeprazole. Discharged patient with medications for urinary tract infection. Discussed with patient proper GERD diet. [facility]. Discussed with patient to closely monitor symptoms and if symptoms are to worsen or change to report back to the ED - strict return instructions given.  Patient agreed to plan of care, understood, all questions answered.   Raymon Mutton, PA-C 11/21/13 2304

## 2013-11-21 NOTE — ED Notes (Signed)
Pt in c/o epigastric/ chest pain since this am, states the pain woke her up, also n/v since that time, denies other symptoms, states pain has been constant, denies radiation, no medication PTA

## 2013-11-22 NOTE — ED Provider Notes (Signed)
Medical screening examination/treatment/procedure(s) were conducted as a shared visit with non-physician practitioner(s) and myself.  I personally evaluated the patient during the encounter.   EKG Interpretation   Date/Time:  Wednesday Nov 21 2013 12:46:33 EDT Ventricular Rate:  67 PR Interval:  136 QRS Duration: 86 QT Interval:  360 QTC Calculation: 380 R Axis:   61 Text Interpretation:  Normal sinus rhythm Normal ECG Confirmed by POLLINA   MD, CHRISTOPHER [phone]([id number]) on 11/21/2013 5:42:45 PM      Patient presents to the ER for evaluation of epigastric discomfort. She has a burning sensation in epigastric area radiating up into the chest. She has mild epigastric tenderness but absolutely no tenderness in the right upper quadrant suggest gallbladder disease. Labrum is unremarkable. No concern for cardiac etiology. Troponins negative x2, negative d-dimer. Patient to be treated with medication for acid reflux, as she has had significant reflux in the past and is no longer taking any medications.  Gilda Creasehristopher J. Pollina, MD 11/22/13 [phone]

## 2013-11-23 LAB — URINE CULTURE: SPECIAL REQUESTS: NORMAL

## 2013-11-25 ENCOUNTER — Telehealth (HOSPITAL_BASED_OUTPATIENT_CLINIC_OR_DEPARTMENT_OTHER): Payer: Self-pay | Admitting: Emergency Medicine

## 2013-11-25 NOTE — Telephone Encounter (Signed)
Post ED Visit - Positive Culture Follow-up  Culture report reviewed by antimicrobial stewardship pharmacist: []  Wes Dulaney, Pharm.D., BCPS [x]  Celedonio MiyamotoJeremy Frens, Pharm.D., BCPS []  Georgina PillionElizabeth Martin, Pharm.D., BCPS []  Bode[place] BayMinh Pham, Vermont[place]arm[place]D., BCPS, AAHIVP []  Estella HuskMichelle Turner, Pharm.D., BCPS, AAHIVP []  Harvie JuniorNathan Cope, Pharm.D.  Positive urine culture Treated with Keflex, organism sensitive to the same and no further patient follow-up is required at this time.  Beatriz Settles 11/25/2013, 9:28 AM

## 2014-03-29 ENCOUNTER — Encounter (HOSPITAL_COMMUNITY): Payer: Self-pay

## 2014-03-29 ENCOUNTER — Inpatient Hospital Stay (HOSPITAL_COMMUNITY)
Admission: AD | Admit: 2014-03-29 | Discharge: 2014-03-29 | Disposition: A | Discharge status: Home / Self Care | Payer: Self-pay | Source: Ambulatory Visit | Attending: Obstetrics and Gynecology | Admitting: Obstetrics and Gynecology

## 2014-03-29 DIAGNOSIS — N898 Other specified noninflammatory disorders of vagina: Secondary | ICD-10-CM | POA: Insufficient documentation

## 2014-03-29 DIAGNOSIS — M545 Low back pain, unspecified: Secondary | ICD-10-CM | POA: Insufficient documentation

## 2014-03-29 DIAGNOSIS — K219 Gastro-esophageal reflux disease without esophagitis: Secondary | ICD-10-CM | POA: Insufficient documentation

## 2014-03-29 LAB — URINALYSIS, ROUTINE W REFLEX MICROSCOPIC
Bilirubin Urine: NEGATIVE
GLUCOSE, UA: NEGATIVE mg/dL
HGB URINE DIPSTICK: NEGATIVE
Ketones, ur: NEGATIVE mg/dL
LEUKOCYTES UA: NEGATIVE
Nitrite: NEGATIVE
PH: 5.5 (ref 5.0–8.0)
PROTEIN: NEGATIVE mg/dL
Urobilinogen, UA: 0.2 mg/dL (ref 0.0–1.0)

## 2014-03-29 LAB — WET PREP, GENITAL
Clue Cells Wet Prep HPF POC: NONE SEEN
TRICH WET PREP: NONE SEEN
YEAST WET PREP: NONE SEEN

## 2014-03-29 LAB — POCT PREGNANCY, URINE: PREG TEST UR: NEGATIVE

## 2014-03-29 LAB — HIV ANTIBODY (ROUTINE TESTING W REFLEX): HIV 1&2 Ab, 4th Generation: NONREACTIVE

## 2014-03-29 MED ORDER — CYCLOBENZAPRINE HCL 10 MG PO TABS: 10.0000 mg | ORAL_TABLET | Freq: Three times a day (TID) | ORAL | Status: DC | PRN

## 2014-03-29 NOTE — MAU Note (Signed)
Pt dx'd with UTI @ [facility] approx one month ago, completed her antibiotic, but still has sx's - lower back pain & vaginal discharge.  Denies dysuria but has frequency.

## 2014-03-29 NOTE — Discharge Instructions (Signed)
Back Exercises Back exercises help treat and prevent back injuries. The goal is to increase your strength in your belly (abdominal) and back muscles. These exercises can also help with flexibility. Start these exercises when told by your doctor. HOME CARE Back exercises include: Pelvic Tilt.  Lie on your back with your knees bent. Tilt your pelvis until the lower part of your back is against the floor. Hold this position 5 to 10 sec. Repeat this exercise 5 to 10 times. Knee to Chest.  Pull 1 knee up against your chest and hold for 20 to 30 seconds. Repeat this with the other knee. This may be done with the other leg straight or bent, whichever feels better. Then, pull both knees up against your chest. Sit-Ups or Curl-Ups.  Bend your knees 90 degrees. Start with tilting your pelvis, and do a partial, slow sit-up. Only lift your upper half 30 to 45 degrees off the floor. Take at least 2 to 3 seonds for each sit-up. Do not do sit-ups with your knees out straight. If partial sit-ups are difficult, simply do the above but with only tightening your belly (abdominal) muscles and holding it as told. Hip-Lift.  Lie on your back with your knees flexed 90 degrees. Push down with your feet and shoulders as you raise your hips 2 inches off the floor. Hold for 10 seconds, repeat 5 to 10 times. Back Arches.  Lie on your stomach. Prop yourself up on bent elbows. Slowly press on your hands, causing an arch in your low back. Repeat 3 to 5 times. Shoulder-Lifts.  Lie face down with arms beside your body. Keep hips and belly pressed to floor as you slowly lift your head and shoulders off the floor. Do not overdo your exercises. Be careful in the beginning. Exercises may cause you some mild back discomfort. If the pain lasts for more than 15 minutes, stop the exercises until you see your doctor. Improvement with exercise for back problems is slow.  Document Released: 08/07/2010 Document Revised: 09/27/2011  Document Reviewed: 05/06/2011 [facility] Patient Information 2015 [place], [place]. This information is not intended to replace advice given to you by your health care provider. Make sure you discuss any questions you have with your health care provider.  Back Injury Prevention Back injuries can be extremely painful and difficult to heal. After having one back injury, you are much more likely to experience another later on. It is important to learn how to avoid injuring or re-injuring your back. The following tips can help you to prevent a back injury. PHYSICAL FITNESS  Exercise regularly and try to develop good tone in your abdominal muscles. Your abdominal muscles provide a lot of the support needed by your back.  Do aerobic exercises (walking, jogging, biking, swimming) regularly.  Do exercises that increase balance and strength (tai chi, yoga) regularly. This can decrease your risk of falling and injuring your back.  Stretch before and after exercising.  Maintain a healthy weight. The more you weigh, the more stress is placed on your back. For every pound of weight, 10 times that amount of pressure is placed on the back. DIET  Talk to your caregiver about how much calcium and vitamin D you need per day. These nutrients help to prevent weakening of the bones (osteoporosis). Osteoporosis can cause broken (fractured) bones that lead to back pain.  Include good sources of calcium in your diet, such as dairy products, green, leafy vegetables, and products with calcium added (fortified).  Include good sources of vitamin D in your diet, such as milk and foods that are fortified with vitamin D. °· Consider taking a nutritional supplement or a multivitamin if needed. °· Stop smoking if you smoke. °POSTURE °· Sit and stand up straight. Avoid leaning forward when you sit or hunching over when you stand. °· Choose chairs with good low back (lumbar) support. °· If you work at a desk, sit close to your work  so you do not need to lean over. Keep your chin tucked in. Keep your neck drawn back and elbows bent at a right angle. Your arms should look like the letter "L." °· Sit high and close to the steering wheel when you drive. Add a lumbar support to your car seat if needed. °· Avoid sitting or standing in one position for too long. Take breaks to get up, stretch, and walk around at least once every hour. Take breaks if you are driving for long periods of time. °· Sleep on your side with your knees slightly bent, or sleep on your back with a pillow under your knees. Do not sleep on your stomach. °LIFTING, TWISTING, AND REACHING °· Avoid heavy lifting, especially repetitive lifting. If you must do heavy lifting: °¨ Stretch before lifting. °¨ Work slowly. °¨ Rest between lifts. °¨ Use carts and dollies to move objects when possible. °¨ Make several small trips instead of carrying 1 heavy load. °¨ Ask for help when you need it. °¨ Ask for help when moving big, awkward objects. °· Follow these steps when lifting: °¨ Stand with your feet shoulder-width apart. °¨ Get as close to the object as you can. Do not try to pick up heavy objects that are far from your body. °¨ Use handles or lifting straps if they are available. °¨ Bend at your knees. Squat down, but keep your heels off the floor. °¨ Keep your shoulders pulled back, your chin tucked in, and your back straight. °¨ Lift the object slowly, tightening the muscles in your legs, abdomen, and buttocks. Keep the object as close to the center of your body as possible. °¨ When you put a load down, use these same guidelines in reverse. °· Do not: °¨ Lift the object above your waist. °¨ Twist at the waist while lifting or carrying a load. Move your feet if you need to turn, not your waist. °¨ Bend over without bending at your knees. °· Avoid reaching over your head, across a table, or for an object on a high surface. °OTHER TIPS °· Avoid wet floors and keep sidewalks clear of ice  to prevent falls. °· Do not sleep on a mattress that is too soft or too hard. °· Keep items that are used frequently within easy reach. °· Put heavier objects on shelves at waist level and lighter objects on lower or higher shelves. °· Find ways to decrease your stress, such as exercise, massage, or relaxation techniques. Stress can build up in your muscles. Tense muscles are more vulnerable to injury. °· Seek treatment for depression or anxiety if needed. These conditions can increase your risk of developing back pain. °SEEK MEDICAL CARE IF: °· You injure your back. °· You have questions about diet, exercise, or other ways to prevent back injuries. °MAKE SURE YOU: °· Understand these instructions. °· Will watch your condition. °· Will get help right away if you are not doing well or get worse. °Document Released: 08/12/2004 Document Revised: 09/27/2011 Document Reviewed: 08/16/2011 °[employer]® Patient Information ©  2015 [employer]. This information is not intended to replace advice given to you by your health care provider. Make sure you discuss any questions you have with your health care provider.

## 2014-03-29 NOTE — MAU Provider Note (Signed)
CC: Back Pain and Urinary Frequency    First Provider Initiated Contact with Patient 03/29/14 1100      HPI Danielle Frost is a 43 y.o. [id number] who presents with onset of mid low back pain about 2 weeks ago. The back pain is dull and intermittent. Similar to back pain she has had in the past. Does lift some boxes of work but is concerned she may have UTI since she had asymptomatic UTI a few months ago. She has had some urinary frequency no urgency no dysuria no hematuria she states that her vaginal discharge is not normal and that it is more than usual and more clear. Denies irritation or age. Declines GC or Chlamydia which were done at last ED visit. LMP 12/22/13. Denies abnormal bleeding. BTL 21 yrs ago. No recent Pap were GYN care.  Past Medical History  Diagnosis Date  . GERD (gastroesophageal reflux disease)   . Strep pharyngitis     OB History  Gravida Para Term Preterm AB SAB TAB Ectopic Multiple Living  # Outcome Date GA Lbr Len/2nd Weight Sex Delivery Anes PTL Lv  3 TRM           2 TRM           1 TRM               Past Surgical History  Procedure Laterality Date  . Appendectomy    . Tubal ligation      History   Social History  . Marital Status: Single    Spouse Name: N/A    Number of Children: N/A  . Years of Education: N/A   Occupational History  . Not on file.   Social History Main Topics  . Smoking status: Never Smoker   . Smokeless tobacco: Never Used  . Alcohol Use: Yes     Comment: occasional  . Drug Use: No  . Sexual Activity: Yes    Birth Control/ Protection: Surgical   Other Topics Concern  . Not on file   Social History Narrative  . No narrative on file    No current facility-administered medications on file prior to encounter.   No current outpatient prescriptions on file prior to encounter.    No Known Allergies  ROS Pertinent items in HPI  PHYSICAL EXAM Filed Vitals:   03/29/14 0914  BP: 122/87  Pulse:  77  Temp: 98.1 F (36.7 C)  Resp: 18   General: Overweight female in no acute distress Cardiovascular: Normal rate Respiratory: Normal effort Abdomen: Soft, nontender Back: No CVAT, mild L-S paraspinous TTP Extremities: No edema Neurologic: Alert and oriented Speculum exam: NEFG; vagina with frothy discharge, no blood; cervix clean Bimanual exam: cervix closed, no CMT; uterus NSSP; no adnexal tenderness or masses   LAB RESULTS Results for orders placed during the hospital encounter of 03/29/14 (from the past 24 hour(s))  URINALYSIS, ROUTINE W REFLEX MICROSCOPIC     Status: Abnormal   Collection Time    03/29/14  9:18 AM      Result Value Ref Range   Color, Urine YELLOW  YELLOW   APPearance CLEAR  CLEAR   Specific Gravity, Urine <1.005 (*) 1.005 - 1.030   pH 5.5  5.0 - 8.0   Glucose, UA NEGATIVE  NEGATIVE mg/dL   Hgb urine dipstick NEGATIVE  NEGATIVE   Bilirubin Urine NEGATIVE  NEGATIVE   Ketones, ur NEGATIVE  NEGATIVE mg/dL   Protein, ur NEGATIVE  NEGATIVE mg/dL   Urobilinogen, UA 0.2  0.0 - 1.0 mg/dL   Nitrite NEGATIVE  NEGATIVE   Leukocytes, UA NEGATIVE  NEGATIVE  POCT PREGNANCY, URINE     Status: None   Collection Time    03/29/14  9:28 AM      Result Value Ref Range   Preg Test, Ur NEGATIVE  NEGATIVE  WET PREP, GENITAL     Status: Abnormal   Collection Time    03/29/14 11:44 AM      Result Value Ref Range   Yeast Wet Prep HPF POC NONE SEEN  NONE SEEN   Trich, Wet Prep NONE SEEN  NONE SEEN   Clue Cells Wet Prep HPF POC NONE SEEN  NONE SEEN   WBC, Wet Prep HPF POC FEW (*) NONE SEEN    IMAGING No results found.  MAU COURSE  Care assumed by Vonzella Nipple, PA at 1210 1210 - assumed care from Caren Griffins, CNM. Wet prep pending.   ASSESSMENT  1. Low back pain without sciatica, unspecified back pain laterality     PLAN Discharge home. See AVS for patient education. Rx for Flexeril given Back injury prevention instructions given Patient advised to follow-up  with a PCP Patient may return to MAU as needed or if her condition were to change or worsen      Follow-up Information   Follow up with [facility] [place] [facility]ERN[facility] ADMISS[facility]. (As needed, If symptoms worsen)    Contact information:   [address] [id number] [place] [address] [phone]      Schedule an appointment as soon as possible for a visit with [facility].   Contact information:   [address] [id number] [place] [address] [phone]       Medication List         cyclobenzaprine 10 MG tablet  Commonly known as:  FLEXERIL  Take 1 tablet (10 mg total) by mouth 3 (three) times daily as needed for muscle spasms.          Marny Lowenstein, PA-C 03/29/2014 12:22 PM

## 2014-03-29 NOTE — MAU Note (Signed)
Pt notes a lot of urgency with voiding, then voids small amounts. White discharge noted with odor.

## 2014-04-02 NOTE — MAU Provider Note (Signed)
Attestation of Attending Supervision of Advanced Practitioner (CNM/NP): Evaluation and management procedures were performed by the Advanced Practitioner under my supervision and collaboration.  I have reviewed the Advanced Practitioner's note and chart, and I agree with the management and plan.  Marlo Arriola 04/02/2014 9:27 AM   

## 2014-05-20 ENCOUNTER — Encounter (HOSPITAL_COMMUNITY): Payer: Self-pay

## 2014-07-16 ENCOUNTER — Other Ambulatory Visit: Payer: Self-pay | Admitting: Obstetrics and Gynecology

## 2014-07-16 DIAGNOSIS — Z1231 Encounter for screening mammogram for malignant neoplasm of breast: Secondary | ICD-10-CM

## 2014-08-15 ENCOUNTER — Ambulatory Visit (HOSPITAL_COMMUNITY): Payer: Self-pay

## 2014-08-15 ENCOUNTER — Ambulatory Visit (HOSPITAL_COMMUNITY): Payer: Self-pay | Attending: Obstetrics and Gynecology

## 2015-05-28 IMAGING — CR DG CHEST 2V
2 series · 2 of 2 positions shown · non-contrast
Comparison: None.

CLINICAL DATA: Chest pain.

EXAM:
CHEST  2 VIEW

[w chest pa]
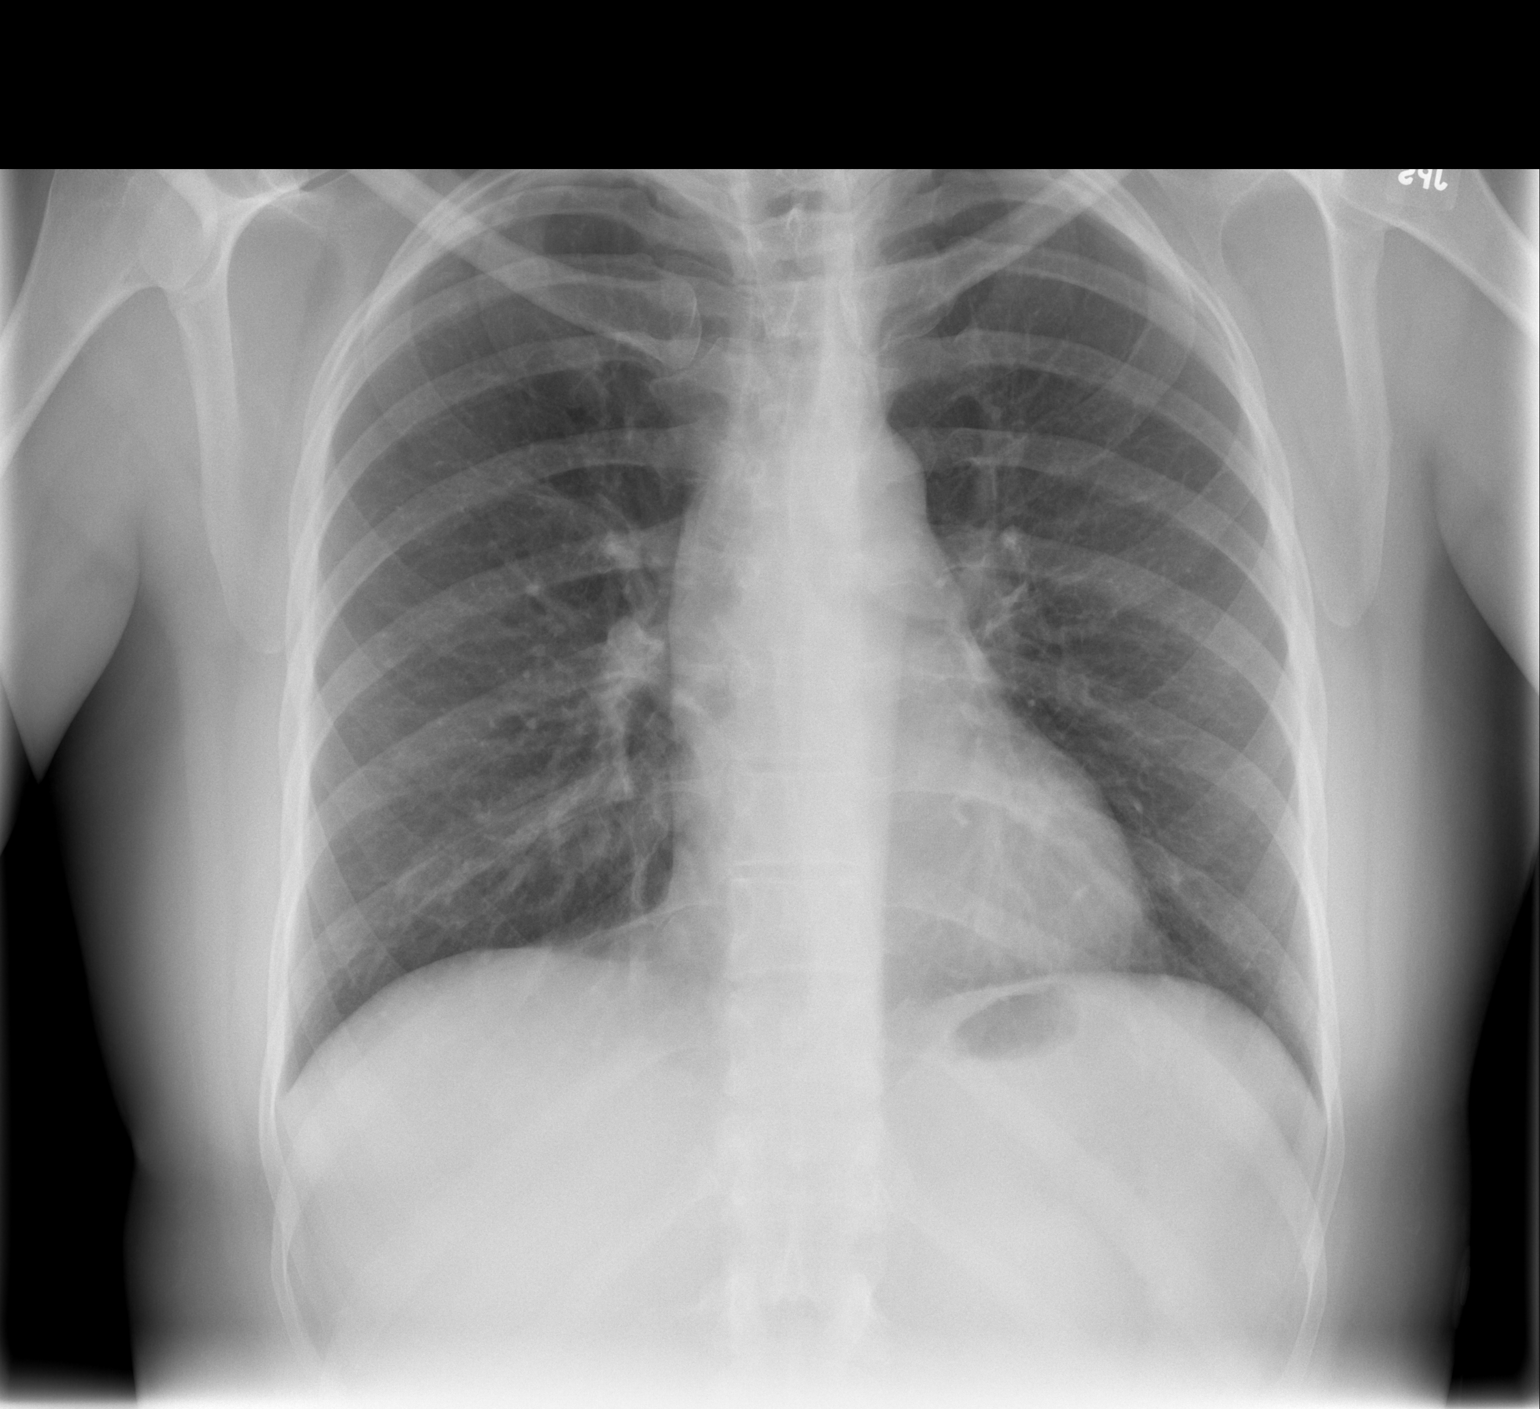

[w chest lat]
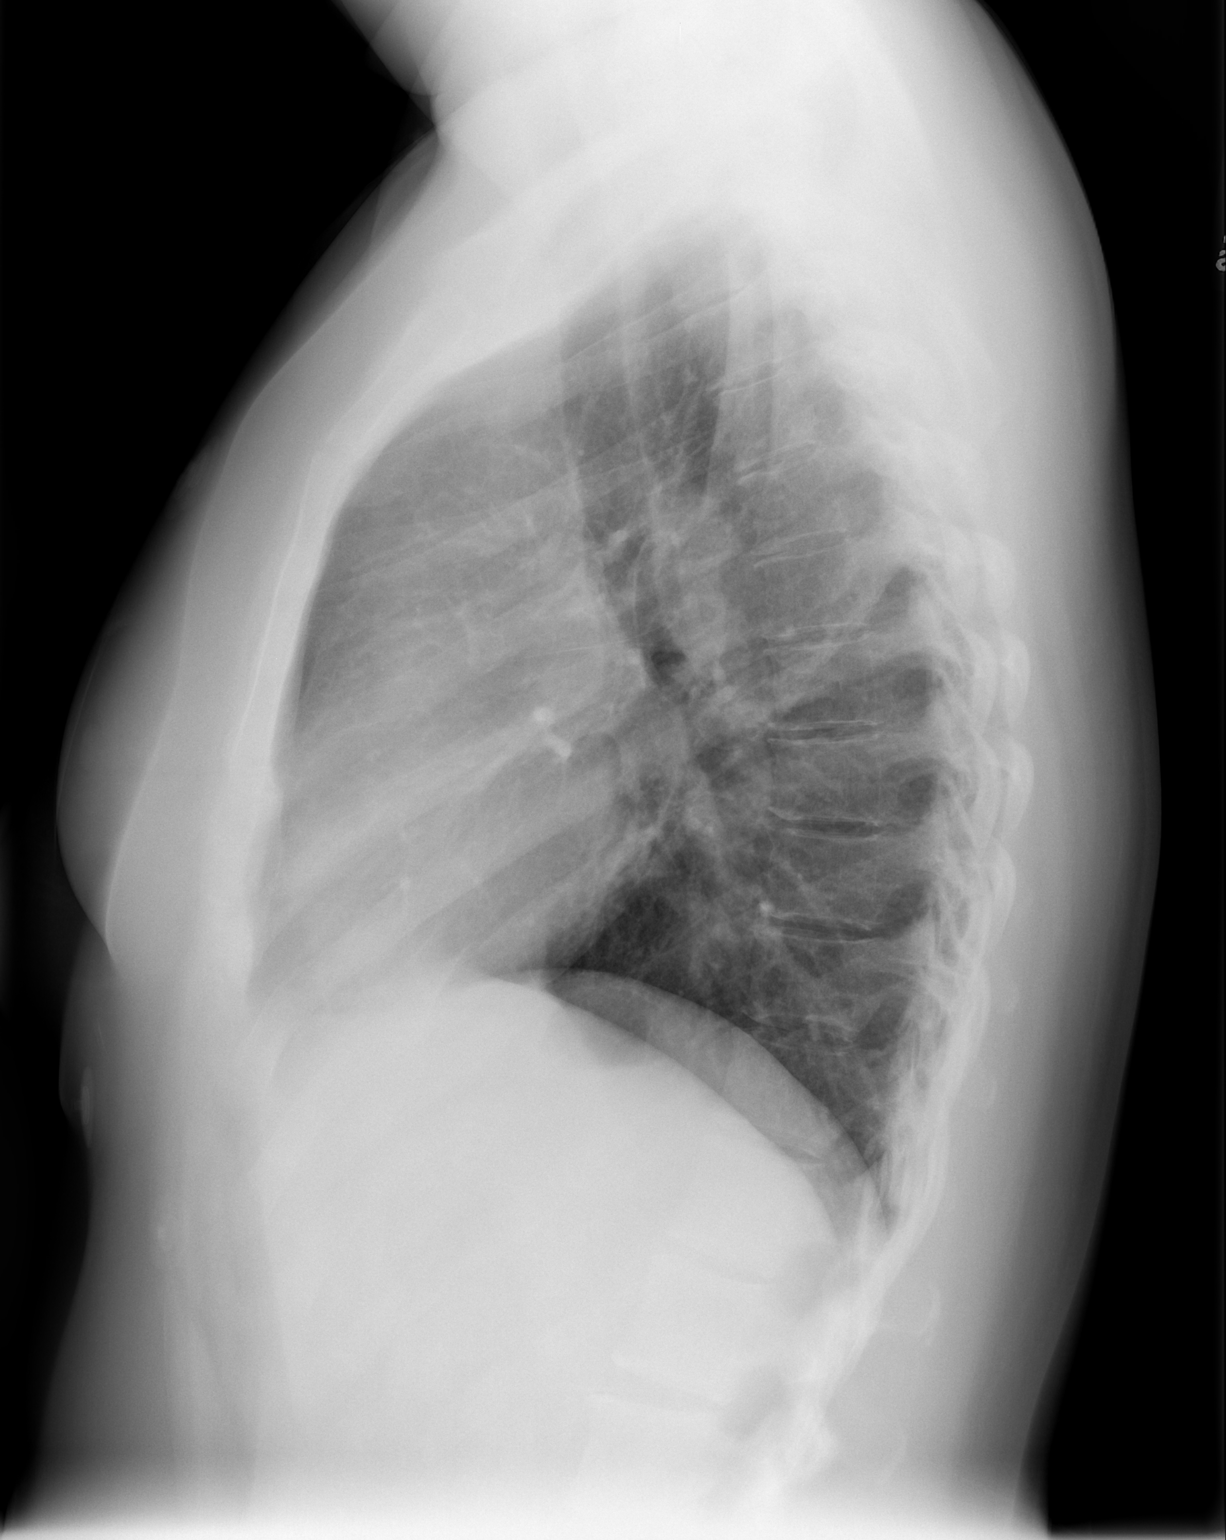

[2 of 2 positions shown; findings below may reference images not displayed]

FINDINGS: The lungs are clear. Heart size is normal. There is no pneumothorax
or pleural effusion.
IMPRESSION: Negative chest.

## 2016-12-21 ENCOUNTER — Emergency Department (HOSPITAL_COMMUNITY)
Admission: EM | Admit: 2016-12-21 | Discharge: 2016-12-21 | Disposition: A | Discharge status: Home / Self Care | Payer: Self-pay | Attending: Emergency Medicine | Admitting: Emergency Medicine

## 2016-12-21 ENCOUNTER — Encounter (HOSPITAL_COMMUNITY): Payer: Self-pay | Admitting: *Deleted

## 2016-12-21 DIAGNOSIS — J020 Streptococcal pharyngitis: Secondary | ICD-10-CM | POA: Insufficient documentation

## 2016-12-21 LAB — RAPID STREP SCREEN (MED CTR MEBANE ONLY): STREPTOCOCCUS, GROUP A SCREEN (DIRECT): POSITIVE — AB

## 2016-12-21 MED ORDER — PENICILLIN G BENZATHINE 1200000 UNIT/2ML IM SUSP
1.2000 10*6.[IU] | Freq: Once | INTRAMUSCULAR | Status: AC
  Administered 2016-12-21: 1.2 10*6.[IU] via INTRAMUSCULAR
  Filled 2016-12-21: qty 2

## 2016-12-21 NOTE — ED Triage Notes (Signed)
C/o sorethroat onset Sun.

## 2016-12-21 NOTE — Discharge Instructions (Signed)
As discussed.  Your  strep test is positive, you were given IM penicillin and should not need any further antibiotics.  He can safely take Tylenol or ibuprofen for discomfort for the next several days as needed

## 2016-12-21 NOTE — ED Provider Notes (Signed)
[facility]-EMERGENCY DEPT Provider Note   CSN: [id number] Arrival date & time: 12/21/16  0403     History   Chief Complaint Chief Complaint  Patient presents with  . Sore Throat    HPI Danielle Frost is a 46 y.o. female.  Is a 46 year old female who presents with 2 days of sore throat.  Denies any fever.  States she has difficulty swallowing.  She does have a history of strep throat infections has no known ill contacts, has not taken any over-the-counter medication for symptom relief      Past Medical History:  Diagnosis Date  . GERD (gastroesophageal reflux disease)   . Strep pharyngitis     There are no active problems to display for this patient.   Past Surgical History:  Procedure Laterality Date  . APPENDECTOMY    . TUBAL LIGATION      OB History    Gravida Para Term Preterm AB Living   3 3 3     3    SAB TAB Ectopic Multiple Live Births                   Home Medications    Prior to Admission medications   Medication Sig Start Date End Date Taking? Authorizing Provider  cyclobenzaprine (FLEXERIL) 10 MG tablet Take 1 tablet (10 mg total) by mouth 3 (three) times daily as needed for muscle spasms. 03/29/14   Marny Lowenstein, PA-C    Family History No family history on file.  Social History Social History  Substance Use Topics  . Smoking status: Never Smoker  . Smokeless tobacco: Never Used  . Alcohol use No     Comment: occasional     Allergies   Patient has no known allergies.   Review of Systems Review of Systems  Constitutional: Negative for fever.  HENT: Positive for congestion and sore throat. Negative for trouble swallowing.   Respiratory: Negative for shortness of breath.   Cardiovascular: Negative for chest pain.  Gastrointestinal: Negative for abdominal pain.  Skin: Negative for rash.  Neurological: Negative for headaches.  All other systems reviewed and are negative.    Physical Exam Updated Vital Signs BP 126/81 (BP  Location: Right Arm)   Pulse 95   Temp 98.3 F (36.8 C) (Oral)   Resp 18   SpO2 98%   Physical Exam  Constitutional: She appears well-developed and well-nourished. No distress.  HENT:  Head: Normocephalic.  Mouth/Throat: Uvula is midline. Oropharyngeal exudate and posterior oropharyngeal erythema present. No tonsillar abscesses.  Eyes: Pupils are equal, round, and reactive to light.  Neck: Normal range of motion.  Cardiovascular: Normal rate.   Pulmonary/Chest: Effort normal.  Abdominal: Soft.  Musculoskeletal: Normal range of motion.  Lymphadenopathy:    She has cervical adenopathy.  Neurological: She is alert.  Skin: Skin is warm.  Psychiatric: She has a normal mood and affect.  Nursing note and vitals reviewed.    ED Treatments / Results  Labs (all labs ordered are listed, but only abnormal results are displayed) Labs Reviewed  RAPID STREP SCREEN (NOT AT [facility]) - Abnormal; Notable for the following:       Result Value   Streptococcus, Group A Screen (Direct) POSITIVE (*)    All other components within normal limits    EKG  EKG Interpretation None       Radiology No results found.  Procedures Procedures (including critical care time)  Medications Ordered in ED Medications  penicillin g benzathine (BICILLIN  LA) 1200000 UNIT/2ML injection 1.2 Million Units (not administered)     Initial Impression / Assessment and Plan / ED Course  I have reviewed the triage vital signs and the nursing notes.  Pertinent labs & imaging results that were available during my care of the patient were reviewed by me and considered in my medical decision making (see chart for details).      Strep test obtained Patient is strep positive.  She's opted for IM penicillin  Final Clinical Impressions(s) / ED Diagnoses   Final diagnoses:  Strep pharyngitis    New Prescriptions New Prescriptions   No medications on file     Earley FavorSchulz, Sandrina Heaton, NP 12/21/16 0509    Earley FavorSchulz,  Caliya Narine, NP 12/21/16 [phone]    Zadie RhineWickline, Donald, MD 12/21/16 [phone]

## 2017-03-11 ENCOUNTER — Telehealth: Payer: Self-pay | Admitting: Cardiology

## 2017-03-11 ENCOUNTER — Encounter (HOSPITAL_COMMUNITY): Payer: Self-pay | Admitting: Emergency Medicine

## 2017-03-11 DIAGNOSIS — B029 Zoster without complications: Secondary | ICD-10-CM | POA: Insufficient documentation

## 2017-03-11 NOTE — Telephone Encounter (Signed)
error 

## 2017-03-11 NOTE — ED Triage Notes (Signed)
Reports rash to back, left thigh and groin since wearing new pants on Sunday. Fluid filled blisters along dermatome on L back, looks like shingles.

## 2017-03-12 ENCOUNTER — Emergency Department (HOSPITAL_COMMUNITY)
Admission: EM | Admit: 2017-03-12 | Discharge: 2017-03-12 | Disposition: A | Discharge status: Home / Self Care | Payer: Self-pay | Attending: Emergency Medicine | Admitting: Emergency Medicine

## 2017-03-12 DIAGNOSIS — B029 Zoster without complications: Secondary | ICD-10-CM

## 2017-03-12 MED ORDER — OXYCODONE-ACETAMINOPHEN 5-325 MG PO TABS
ORAL_TABLET | ORAL | Status: AC
  Filled 2017-03-12: qty 1

## 2017-03-12 MED ORDER — OXYCODONE-ACETAMINOPHEN 5-325 MG PO TABS: 1.0000 | ORAL_TABLET | Freq: Four times a day (QID) | ORAL | 0 refills | Status: DC | PRN

## 2017-03-12 MED ORDER — OXYCODONE-ACETAMINOPHEN 5-325 MG PO TABS
1.0000 | ORAL_TABLET | ORAL | Status: DC | PRN
  Administered 2017-03-12: 1 via ORAL

## 2017-03-12 MED ORDER — ACYCLOVIR 800 MG PO TABS
800.0000 mg | ORAL_TABLET | Freq: Every day | ORAL | 0 refills | Status: DC
Start: 2017-03-12 — End: 2017-10-16

## 2017-03-12 MED ORDER — ACYCLOVIR 800 MG PO TABS
800.0000 mg | ORAL_TABLET | Freq: Once | ORAL | Status: AC
  Administered 2017-03-12: 800 mg via ORAL
  Filled 2017-03-12 (×2): qty 1

## 2017-03-12 NOTE — ED Provider Notes (Signed)
TIME SEEN: 4:34 AM  CHIEF COMPLAINT: Painful rash for 5 days  HPI: Patient is a 46 year old female with no significant past medical history who presents to the emergency department with a painful rash that has been present for the past 5-6 days. Patient denies fevers. No sick contacts. She thinks that she has had chickenpox before. She has never had shingles before.  ROS: See HPI Constitutional: no fever  Eyes: no drainage  ENT: no runny nose   Cardiovascular:  no chest pain  Resp: no SOB  GI: no vomiting GU: no dysuria Integumentary:  rash  Allergy: no hives  Musculoskeletal: no leg swelling  Neurological: no slurred speech ROS otherwise negative  PAST MEDICAL HISTORY/PAST SURGICAL HISTORY:  Past Medical History:  Diagnosis Date  . GERD (gastroesophageal reflux disease)   . Strep pharyngitis     MEDICATIONS:  Prior to Admission medications   Medication Sig Start Date End Date Taking? Authorizing Provider  cyclobenzaprine (FLEXERIL) 10 MG tablet Take 1 tablet (10 mg total) by mouth 3 (three) times daily as needed for muscle spasms. 03/29/14   Marny Lowenstein, PA-C    ALLERGIES:  No Known Allergies  SOCIAL HISTORY:  Social History  Substance Use Topics  . Smoking status: Never Smoker  . Smokeless tobacco: Never Used  . Alcohol use No     Comment: occasional    FAMILY HISTORY: No family history on file.  EXAM: BP 113/65 (BP Location: Left Arm)   Pulse 79   Temp 98 F (36.7 C) (Oral)   Resp 18   Ht 5\' 5"  (1.651 m)   Wt 86.2 kg (190 lb)   LMP 08/20/2015 (Within Days)   SpO2 98%   BMI 31.62 kg/m  CONSTITUTIONAL: Alert and oriented and responds appropriately to questions. Well-appearing; well-nourished HEAD: Normocephalic EYES: Conjunctivae clear, pupils appear equal, EOMI ENT: normal nose; moist mucous membranes NECK: Supple, no meningismus, no nuchal rigidity, no LAD  CARD: RRR; S1 and S2 appreciated; no murmurs, no clicks, no rubs, no gallops RESP: Normal  chest excursion without splinting or tachypnea; breath sounds clear and equal bilaterally; no wheezes, no rhonchi, no rales, no hypoxia or respiratory distress, speaking full sentences ABD/GI: Normal bowel sounds; non-distended; soft, non-tender, no rebound, no guarding, no peritoneal signs, no hepatosplenomegaly BACK:  The back appears normal and is non-tender to palpation, there is no CVA tenderness EXT: Normal ROM in all joints; non-tender to palpation; no edema; normal capillary refill; no cyanosis, no calf tenderness or swelling    SKIN: Normal color for age and race; warm; patient has an erythematous and vesicular rash to the left L1 dermatome that is on her back and also in her groin region and on her left labia majora, most of the lesions are crusted over, there is no sign of superimposed cellulitis, no drainage; no petechiae or purpura, no blisters or desquamation, no urticaria NEURO: Moves all extremities equally PSYCH: The patient's mood and manner are appropriate. Grooming and personal hygiene are appropriate.  MEDICAL DECISION MAKING: Patient here with what appears to be herpes zoster. She does appear to have some new lesions despite having symptoms for 5 days. Therefore we will place her on Acyclovir 5 times a day for the next week. Will also discharge with Percocet. Have given her outpatient PCP follow-up information. We did discuss risk of residual neuropathic pain with shingles and the importance of staying away from children, elderly, immunocompromised and pregnant people at this time. We discussed how contagious this  rash is until all lesions have been completely crusted over. Discussed return precautions.  At this time, I do not feel there is any life-threatening condition present. I have reviewed and discussed all results (EKG, imaging, lab, urine as appropriate) and exam findings with patient/family. I have reviewed nursing notes and appropriate previous records.  I feel the patient  is safe to be discharged home without further emergent workup and can continue workup as an outpatient as needed. Discussed usual and customary return precautions. Patient/family verbalize understanding and are comfortable with this plan.  Outpatient follow-up has been provided if needed. All questions have been answered.      Jeda Pardue, Layla Maw, DO 03/12/17 [phone]

## 2017-03-12 NOTE — Discharge Instructions (Signed)
To find a primary care or specialty doctor please call [phone] or [phone] to access "Ideal Find a Doctor Service." ° °You may also go on the [facility] website at [email or web address] ° °There are also multiple [facility], [facility], [facility] and [facility] practices throughout the [place] that are frequently accepting new patients. You may find a clinic that is close to your home and contact them. ° °[facility] -  °[address] °[place] [address] °[phone] ° ° °[facility] -  °[address] °[place] [address] °[phone] ° ° °[facility] - °[address]  °[place] [address] °[phone] ° ° °

## 2017-03-23 ENCOUNTER — Inpatient Hospital Stay: Payer: Self-pay

## 2017-07-25 ENCOUNTER — Encounter (HOSPITAL_COMMUNITY): Payer: Self-pay | Admitting: Emergency Medicine

## 2017-07-25 ENCOUNTER — Emergency Department (HOSPITAL_COMMUNITY): Payer: Self-pay

## 2017-07-25 ENCOUNTER — Other Ambulatory Visit: Payer: Self-pay

## 2017-07-25 ENCOUNTER — Emergency Department (HOSPITAL_COMMUNITY)
Admission: EM | Admit: 2017-07-25 | Discharge: 2017-07-25 | Disposition: A | Discharge status: Home / Self Care | Payer: Self-pay | Attending: Emergency Medicine | Admitting: Emergency Medicine

## 2017-07-25 DIAGNOSIS — B9789 Other viral agents as the cause of diseases classified elsewhere: Secondary | ICD-10-CM | POA: Insufficient documentation

## 2017-07-25 DIAGNOSIS — J069 Acute upper respiratory infection, unspecified: Secondary | ICD-10-CM | POA: Insufficient documentation

## 2017-07-25 MED ORDER — BENZONATATE 100 MG PO CAPS: 100.0000 mg | ORAL_CAPSULE | Freq: Three times a day (TID) | ORAL | 0 refills | Status: DC

## 2017-07-25 NOTE — ED Triage Notes (Signed)
Pt presents with cough, congestion, runny nose, and sob x 3-4 days; no fever

## 2017-07-25 NOTE — ED Notes (Signed)
WOW not present at current time to have patient sign. Pt voices understanding of discharge instructions and follow up information. NAD. Ambulatory.

## 2017-07-25 NOTE — ED Provider Notes (Signed)
[facility] EMERGENCY DEPARTMENT Provider Note   CSN: [id number] Arrival date & time: 07/25/17  6962     History   Chief Complaint Chief Complaint  Patient presents with  . Nasal Congestion  . Cough  . Shortness of Breath    HPI Danielle Frost is a 47 y.o. female with a past medical history of GERD who presents the emergency department today for cough.  Patient states of the last 2 days she has had nasal congestion, rhinorrhea and a dry, nonproductive cough.  Patient notes that she has been taking Mucinex with significant relief of her symptoms.  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  She denies any other shortness of breath besides this episode.  No temporality to the cough. Denies fever, chills, myalgia's, arthralgia's, night sweats, weight loss, recent travel or surgeries, lower leg swelling, chest pain, DOE, sputum production, hemoptysis, neck stiffness, sinus pain or sore throat. No sick contacts. Never smoker. No new medications or use of ACE-I. No relation to food. No history of asthma, COPD, or CHF. Patient is not immunocompromised (HIV, chronic steroid use, etc.). She did not get the flu vaccine this year.  HPI  Past Medical History:  Diagnosis Date  . GERD (gastroesophageal reflux disease)   . Strep pharyngitis     There are no active problems to display for this patient.   Past Surgical History:  Procedure Laterality Date  . APPENDECTOMY    . TUBAL LIGATION      OB History    Gravida Para Term Preterm AB Living   3 3 3     3    SAB TAB Ectopic Multiple Live Births                   Home Medications    Prior to Admission medications   Medication Sig Start Date End Date Taking? Authorizing Provider  acyclovir (ZOVIRAX) 800 MG tablet Take 1 tablet (800 mg total) by mouth 5 (five) times daily. For  one week 03/12/17   Ward, Layla Maw, DO  cyclobenzaprine (FLEXERIL) 10 MG tablet Take 1 tablet (10 mg total) by mouth 3 (three) times daily as needed for muscle spasms. 03/29/14   Marny Lowenstein, PA-C  oxyCODONE-acetaminophen (PERCOCET/ROXICET) 5-325 MG tablet Take 1 tablet by mouth every 6 (six) hours as needed. 03/12/17   Ward, Layla Maw, DO    Family History History reviewed. No pertinent family history.  Social History Social History   Tobacco Use  . Smoking status: Never Smoker  . Smokeless tobacco: Never Used  Substance Use Topics  . Alcohol use: No    Comment: occasional  . Drug use: No     Allergies   Patient has no known allergies.   Review of Systems Review of Systems  All other systems reviewed and are negative.    Physical Exam Updated Vital Signs BP 132/89 (BP Location: Right Arm)   Pulse 89   Temp 97.6 F (36.4 C) (Oral)   Resp 16   Ht 5\' 5"  (1.651 m)   Wt 81.6 kg (180 lb)   LMP 08/20/2015 (Within Days)   SpO2 98%   BMI 29.95 kg/m   Physical Exam  Constitutional: She appears well-developed and well-nourished.  HENT:  Head: Normocephalic and atraumatic.  Right Ear: Tympanic membrane, external ear and ear canal normal.  Left Ear: Tympanic membrane, external ear and ear canal normal.  Nose: Rhinorrhea present. No mucosal edema. Right sinus exhibits no maxillary sinus tenderness and no frontal sinus tenderness. Left sinus exhibits no maxillary sinus tenderness and no frontal sinus tenderness.  Mouth/Throat: Uvula is midline, oropharynx is clear and moist and mucous membranes are normal. No tonsillar exudate.  The patient has normal phonation and is in control of secretions. No stridor.  Midline uvula without edema. Soft palate rises symmetrically.  No tonsillar erythema or exudates. No PTA. Tongue protrusion is normal. No trismus. No creptius on neck palpation and patient has good dentition. No gingival erythema or fluctuance noted. Mucus membranes moist.    Eyes: Pupils are equal, round, and reactive to light. Right eye exhibits no discharge. Left eye exhibits no discharge. No scleral icterus.  Neck: Trachea normal. Neck supple. No spinous process tenderness present. No neck rigidity. Normal range of motion present.  No nuchal rigidity or meningismus  Cardiovascular: Normal rate, regular rhythm and intact distal pulses.  No murmur heard. Pulses:      Radial pulses are 2+ on the right side, and 2+ on the left side.       Dorsalis pedis pulses are 2+ on the right side, and 2+ on the left side.       Posterior tibial pulses are 2+ on the right side, and 2+ on the left side.  No lower extremity swelling or edema. Calves symmetric in size bilaterally.  Pulmonary/Chest: Effort normal and breath sounds normal. No accessory muscle usage. No tachypnea. No respiratory distress. She has no decreased breath sounds. She has no wheezes. She has no rhonchi. She has no rales. She exhibits no tenderness.  Abdominal: Soft. Bowel sounds are normal. There is no tenderness. There is no rebound and no guarding.  Musculoskeletal: She exhibits no edema.  Lymphadenopathy:    She has no cervical adenopathy.  Neurological: She is alert.  Skin: Skin is warm and dry. No rash noted. She is not diaphoretic.  Psychiatric: She has a normal mood and affect.  Nursing note and vitals reviewed.    ED Treatments / Results  Labs (all labs ordered are listed, but only abnormal results are displayed) Labs Reviewed - No data to display  EKG  EKG Interpretation None       Radiology Dg Chest 2 View  Result Date: 07/25/2017 CLINICAL DATA:  47 y/o F; cough, congestion, runny nose, and shortness of breath for 3-4 days. No fever. EXAM: CHEST  2 VIEW COMPARISON:  11/21/2013 chest radiograph FINDINGS: Stable heart size and mediastinal contours are within normal limits. Both lungs are clear. The visualized skeletal structures are unremarkable. IMPRESSION: No acute pulmonary process  identified. Electronically Signed   By: Mitzi HansenLance  Furusawa-Stratton M.D.   On: 07/25/2017 04:13    Procedures Procedures (including critical care time)  Medications Ordered in ED Medications - No data to display   Initial Impression / Assessment and Plan / ED Course  I have reviewed the triage vital signs and the nursing notes.  Pertinent labs & imaging results that were available during my care of the patient were reviewed by me and considered in my medical decision making (see chart for details).     Patient presenting with nasal congestion with nonproductive cough. Patient's vital signs are reassuring. Pt CXR negative for acute infiltrate. Patients symptoms are consistent with URI, likely viral etiology. Discussed that antibiotics are not indicated for viral infections. Pt will be discharged with symptomatic treatment.  Verbalizes understanding and is agreeable with plan. Pt is hemodynamically stable & in NAD prior to dc.  Final Clinical Impressions(s) / ED Diagnoses   Final diagnoses:  Viral URI with cough    ED Discharge Orders    None       Princella Pellegrini 07/25/17 6045    Linwood Dibbles, MD 07/25/17 [phone]

## 2017-07-25 NOTE — Discharge Instructions (Signed)
Please read and follow all provided instructions.  Your diagnoses today include:  1. Viral URI with cough     Tests performed today include: Vital signs. See below for your results today.  Chest xray - negative  EKG - reassuring  Medications prescribed/advised:  1. Musinex [Guaifenesin] as a decongestant [thin mucus - you have to be well hydrated when taking this for it to work] - please pick this up over the counter. Make sure you are well hydrated when taking this medication.  2. Tylenol for fever/pain and Motrin/Ibuprofen for muscle aches 3. Cough Suppressant: Take as directed.   Home care instructions:  An upper respiratory infection (URI) is also sometimes known as the common cold. Most people improve within 1 week, but symptoms can last up to 2 weeks. A residual cough may last even longer.   URI is most commonly caused by a virus. Viruses are NOT treated with antibiotics. You can easily spread the virus to others by oral contact. This includes kissing, sharing a glass, coughing, or sneezing. Touching your mouth or nose and then touching a surface, which is then touched by another person, can also spread the virus.   TREATMENT  Treatment is directed at relieving symptoms. There is no cure. Antibiotics are not effective, because the infection is caused by a virus, not by bacteria. Treatment may include:  Increased fluid intake. Sports drinks offer valuable electrolytes, sugars, and fluids.  Breathing heated mist or steam (vaporizer or shower).  Eating chicken soup or other clear broths, and maintaining good nutrition.  Getting plenty of rest.  Using gargles or lozenges for comfort.  Controlling fevers with ibuprofen or acetaminophen as directed by your caregiver.  Increasing usage of your inhaler if you have asthma.  Return to work when your temperature has returned to normal.   Follow-up instructions: Followup with your primary care doctor in 4 days if your symptoms persist.   Your more than welcome to return to the emergency department if symptoms worsen or become concerning.  Return instructions:  Please return to the Emergency Department if you do not get better, if you get worse, or new symptoms OR  - Fever (temperature greater than 101.83F)  - Bleeding that does not stop with holding pressure to the area    -Severe pain (please note that you may be more sore the day after your accident)  - Chest Pain  - Difficulty breathing (worsening shortness of breath with sputum production may  be a sign of pneumonia.   - Severe nausea or vomiting  - Inability to tolerate food and liquids  - Passing out  - Skin becoming red around your wounds  - Change in mental status (confusion or lethargy)  - New numbness or weakness     -You develop fever, swollen neck glands, pain with swallowing or white areas on  the back of your throat. This may be a sign of strep throat.  Please return if you have any other emergent concerns.  Additional Information:  Your vital signs today were: BP 132/89 (BP Location: Right Arm)    Pulse 89    Temp 97.6 F (36.4 C) (Oral)    Resp 16    Ht 5\' 5"  (1.651 m)    Wt 81.6 kg (180 lb)    LMP 08/20/2015 (Within Days)    SpO2 98%    BMI 29.95 kg/m  If your blood pressure (BP) was elevated above 135/85 this visit, please have this repeated by your doctor  within one month.

## 2017-08-17 ENCOUNTER — Encounter (HOSPITAL_COMMUNITY): Payer: Self-pay | Admitting: Emergency Medicine

## 2017-08-17 DIAGNOSIS — R05 Cough: Secondary | ICD-10-CM | POA: Insufficient documentation

## 2017-08-17 DIAGNOSIS — R0981 Nasal congestion: Secondary | ICD-10-CM | POA: Insufficient documentation

## 2017-08-17 NOTE — ED Triage Notes (Signed)
Pt comes in after being seen [facility] for an URI on the 7th and has continued to have symptoms.  States she has been around people with the flu and wants to make sure she doesn't have it. Sounds congested during triage.  No other complaints at this time.

## 2017-08-18 ENCOUNTER — Emergency Department (HOSPITAL_COMMUNITY)
Admission: EM | Admit: 2017-08-18 | Discharge: 2017-08-18 | Disposition: A | Discharge status: Home / Self Care | Payer: Self-pay | Attending: Emergency Medicine | Admitting: Emergency Medicine

## 2017-08-18 DIAGNOSIS — R0981 Nasal congestion: Secondary | ICD-10-CM

## 2017-08-18 DIAGNOSIS — R05 Cough: Secondary | ICD-10-CM

## 2017-08-18 DIAGNOSIS — R059 Cough, unspecified: Secondary | ICD-10-CM

## 2017-08-18 MED ORDER — MUCINEX DM 30-600 MG PO TB12: 1.0000 | ORAL_TABLET | Freq: Two times a day (BID) | ORAL | 0 refills | Status: DC

## 2017-08-18 MED ORDER — BENZONATATE 100 MG PO CAPS: 100.0000 mg | ORAL_CAPSULE | Freq: Three times a day (TID) | ORAL | 0 refills | Status: DC

## 2017-08-18 NOTE — ED Provider Notes (Signed)
[facility]-EMERGENCY DEPT Provider Note   CSN: [id number] Arrival date & time: 08/17/17  2106     History   Chief Complaint No chief complaint on file.   HPI Danielle Frost is a 47 y.o. female.  The history is provided by the patient and medical records.    47 year old female with history of acid reflux, presenting to the ED with URI type symptoms.  Reports she has had these since July 25, 2017.  She was seen in the ED at that time and had a chest x-ray performed that was negative.  Reports she is continued to have dry cough, nasal congestion, rhinorrhea, postnasal drip.  Does feel some "pressure" in her ears.  States her daughter has been sick with the flu, diagnosed about a week ago.  Patient states she would like to be tested for the flu.  She denies any chest pain or shortness of breath.  She has not been taking any medications for her symptoms.  Past Medical History:  Diagnosis Date  . GERD (gastroesophageal reflux disease)   . Strep pharyngitis     There are no active problems to display for this patient.   Past Surgical History:  Procedure Laterality Date  . APPENDECTOMY    . TUBAL LIGATION      OB History    Gravida Para Term Preterm AB Living   3 3 3     3    SAB TAB Ectopic Multiple Live Births                   Home Medications    Prior to Admission medications   Medication Sig Start Date End Date Taking? Authorizing Provider  acyclovir (ZOVIRAX) 800 MG tablet Take 1 tablet (800 mg total) by mouth 5 (five) times daily. For one week 03/12/17   Ward, Layla Maw, DO  benzonatate (TESSALON) 100 MG capsule Take 1 capsule (100 mg total) by mouth every 8 (eight) hours. 07/25/17   Maczis, Elmer Sow, PA-C  cyclobenzaprine (FLEXERIL) 10 MG tablet Take 1 tablet (10 mg total) by mouth 3 (three) times daily as needed for muscle spasms. 03/29/14   Marny Lowenstein, PA-C  oxyCODONE-acetaminophen (PERCOCET/ROXICET) 5-325 MG tablet Take 1 tablet by  mouth every 6 (six) hours as needed. 03/12/17   Ward, Layla Maw, DO    Family History No family history on file.  Social History Social History   Tobacco Use  . Smoking status: Never Smoker  . Smokeless tobacco: Never Used  Substance Use Topics  . Alcohol use: No    Comment: occasional  . Drug use: No     Allergies   Patient has no known allergies.   Review of Systems Review of Systems  Constitutional: Positive for fatigue.  HENT: Positive for congestion, postnasal drip and rhinorrhea.   Respiratory: Positive for cough.   All other systems reviewed and are negative.    Physical Exam Updated Vital Signs BP 128/77 (BP Location: Left Arm)   Pulse 81   Temp 98.5 F (36.9 C)   Resp 16   LMP 08/20/2015 (Within Days)   SpO2 97%   Physical Exam  Constitutional: She is oriented to person, place, and time. She appears well-developed and well-nourished.  HENT:  Head: Normocephalic and atraumatic.  Right Ear: Tympanic membrane and ear canal normal.  Left Ear: Tympanic membrane and ear canal normal.  Nose: Mucosal edema and rhinorrhea (clear) present.  Mouth/Throat: Uvula is midline, oropharynx is clear and  moist and mucous membranes are normal.  Nasal congestion w/PND  Eyes: Conjunctivae and EOM are normal. Pupils are equal, round, and reactive to light.  Neck: Normal range of motion.  Cardiovascular: Normal rate, regular rhythm and normal heart sounds.  Pulmonary/Chest: Effort normal. No stridor. No respiratory distress. She has no wheezes. She has no rhonchi.  Lungs clear, mild dry cough noted  Abdominal: Soft. Bowel sounds are normal. There is no tenderness. There is no rebound.  Musculoskeletal: Normal range of motion.  Neurological: She is alert and oriented to person, place, and time.  Skin: Skin is warm and dry.  Psychiatric: She has a normal mood and affect.  Nursing note and vitals reviewed.    ED Treatments / Results  Labs (all labs ordered are listed,  but only abnormal results are displayed) Labs Reviewed - No data to display  EKG  EKG Interpretation None       Radiology No results found.  Procedures Procedures (including critical care time)  Medications Ordered in ED Medications - No data to display   Initial Impression / Assessment and Plan / ED Course  I have reviewed the triage vital signs and the nursing notes.  Pertinent labs & imaging results that were available during my care of the patient were reviewed by me and considered in my medical decision making (see chart for details).  47 year old female here with URI type symptoms.  Have been ongoing for a few weeks.  Daughter sick with the flu.  Patient is afebrile and nontoxic.  Exam is benign aside from some nasal congestion and postnasal drip.  Does have a dry cough but lungs are clear without any wheezes or rhonchi on exam.  I reviewed her prior chest x-ray that was negative, do not feel this needs to be repeated.  I discussed with patient that sending a flu swab likely will not change management as she is very far out of the window to start Tamiflu.  Will discharge home with symptomatic care.  Given work note.  Close follow-up with PCP.  Discussed plan with patient, he/she acknowledged understanding and agreed with plan of care.  Return precautions given for new or worsening symptoms.  Final Clinical Impressions(s) / ED Diagnoses   Final diagnoses:  Cough  Nasal congestion    ED Discharge Orders        Ordered    benzonatate (TESSALON) 100 MG capsule  Every 8 hours     08/18/17 0323    Dextromethorphan-Guaifenesin (MUCINEX DM) 30-600 MG TB12  2 times daily     08/18/17 0323       Garlon HatchetSanders, Robby Pirani M, PA-C 08/18/17 0500    Devoria AlbeKnapp, Iva, MD 08/18/17 [phone]

## 2017-08-18 NOTE — ED Notes (Signed)
Pt denies SOB, CP, N/V.

## 2017-08-18 NOTE — Discharge Instructions (Signed)
Take the prescribed medication as directed.  Rest and drink fluids at home. Follow-up with your primary care doctor. Return to the ED for new or worsening symptoms.

## 2017-10-15 ENCOUNTER — Other Ambulatory Visit: Payer: Self-pay

## 2017-10-15 ENCOUNTER — Encounter (HOSPITAL_COMMUNITY): Payer: Self-pay

## 2017-10-15 DIAGNOSIS — R062 Wheezing: Secondary | ICD-10-CM | POA: Insufficient documentation

## 2017-10-15 DIAGNOSIS — R05 Cough: Secondary | ICD-10-CM | POA: Insufficient documentation

## 2017-10-15 NOTE — ED Triage Notes (Signed)
Pt reports cough and wheezing in chest x1 week. Pt denies hx of asthma. Pt denies fevers at home.

## 2017-10-16 ENCOUNTER — Emergency Department (HOSPITAL_COMMUNITY): Payer: Self-pay

## 2017-10-16 ENCOUNTER — Emergency Department (HOSPITAL_COMMUNITY)
Admission: EM | Admit: 2017-10-16 | Discharge: 2017-10-16 | Disposition: A | Discharge status: Home / Self Care | Payer: Self-pay | Attending: Emergency Medicine | Admitting: Emergency Medicine

## 2017-10-16 DIAGNOSIS — R05 Cough: Secondary | ICD-10-CM

## 2017-10-16 DIAGNOSIS — R059 Cough, unspecified: Secondary | ICD-10-CM

## 2017-10-16 MED ORDER — ALBUTEROL SULFATE HFA 108 (90 BASE) MCG/ACT IN AERS
2.0000 | INHALATION_SPRAY | Freq: Once | RESPIRATORY_TRACT | Status: AC
  Administered 2017-10-16: 2 via RESPIRATORY_TRACT
  Filled 2017-10-16: qty 6.7

## 2017-10-16 MED ORDER — ALBUTEROL SULFATE (2.5 MG/3ML) 0.083% IN NEBU
5.0000 mg | INHALATION_SOLUTION | Freq: Once | RESPIRATORY_TRACT | Status: AC
  Administered 2017-10-16: 5 mg via RESPIRATORY_TRACT
  Filled 2017-10-16: qty 6

## 2017-10-16 MED ORDER — IPRATROPIUM BROMIDE 0.02 % IN SOLN
0.5000 mg | Freq: Once | RESPIRATORY_TRACT | Status: AC
  Administered 2017-10-16: 0.5 mg via RESPIRATORY_TRACT
  Filled 2017-10-16: qty 2.5

## 2017-10-16 MED ORDER — ALBUTEROL SULFATE HFA 108 (90 BASE) MCG/ACT IN AERS: 1.0000 | INHALATION_SPRAY | Freq: Four times a day (QID) | RESPIRATORY_TRACT | 0 refills | Status: DC | PRN

## 2017-10-16 MED ORDER — PREDNISONE 20 MG PO TABS
60.0000 mg | ORAL_TABLET | Freq: Once | ORAL | Status: AC
  Administered 2017-10-16: 60 mg via ORAL
  Filled 2017-10-16: qty 3

## 2017-10-16 MED ORDER — PREDNISONE 20 MG PO TABS: ORAL_TABLET | ORAL | 0 refills | Status: DC

## 2017-10-16 MED ORDER — BENZONATATE 100 MG PO CAPS: 100.0000 mg | ORAL_CAPSULE | Freq: Three times a day (TID) | ORAL | 0 refills | Status: DC

## 2017-10-16 NOTE — ED Provider Notes (Signed)
[facility]-EMERGENCY DEPT Provider Note   CSN: [id number] Arrival date & time: 10/15/17  2010     History   Chief Complaint Chief Complaint  Patient presents with  . URI    HPI TECIA CINNAMON is a 47 y.o. female.  The history is provided by the patient and medical records.  URI   Associated symptoms include cough and wheezing.     47 year old female with history of acid reflux, presenting to the ED with URI type symptoms.  States this is been ongoing for about a week.  States she was sick with URI in November and December but has been doing fairly well since that time.  Reports cough is mostly dry, occasional production of white mucus.  She has not had any fever or chills.  States when lying flat she does feel her chest "rattling".  She has no history of asthma.  She has never been a smoker.  She denies any chest pain.  No medications prior to arrival.  Past Medical History:  Diagnosis Date  . GERD (gastroesophageal reflux disease)   . Strep pharyngitis     There are no active problems to display for this patient.   Past Surgical History:  Procedure Laterality Date  . APPENDECTOMY    . TUBAL LIGATION       OB History    Gravida  3   Para  3   Term  3   Preterm      AB      Living  3     SAB      TAB      Ectopic      Multiple      Live Births               Home Medications    Prior to Admission medications   Medication Sig Start Date End Date Taking? Authorizing Provider  guaifenesin (ROBITUSSIN) 100 MG/5ML syrup Take 200 mg by mouth 3 (three) times daily as needed for cough.   Yes [provider]  ibuprofen (ADVIL,MOTRIN) 200 MG tablet Take 600-800 mg by mouth every 6 (six) hours as needed for moderate pain.   Yes [provider]  benzonatate (TESSALON) 100 MG capsule Take 1 capsule (100 mg total) by mouth every 8 (eight) hours. Patient not taking: Reported on 10/16/2017 08/18/17   Garlon Hatchet,  PA-C  Dextromethorphan-Guaifenesin [facility] DM) 30-600 MG TB12 Take 1 tablet by mouth 2 (two) times daily. Patient not taking: Reported on 10/16/2017 08/18/17   Garlon Hatchet, PA-C    Family History History reviewed. No pertinent family history.  Social History Social History   Tobacco Use  . Smoking status: Never Smoker  . Smokeless tobacco: Never Used  Substance Use Topics  . Alcohol use: No    Comment: occasional  . Drug use: No     Allergies   Patient has no known allergies.   Review of Systems Review of Systems  Respiratory: Positive for cough and wheezing.   All other systems reviewed and are negative.    Physical Exam Updated Vital Signs BP (!) 146/82 (BP Location: Left Arm)   Pulse 76   Temp 98.2 F (36.8 C) (Oral)   Resp 14   LMP 08/20/2015 (Within Days)   SpO2 96%   Physical Exam  Constitutional: She is oriented to person, place, and time. She appears well-developed and well-nourished.  HENT:  Head: Normocephalic and atraumatic.  Right Ear: Tympanic  membrane and ear canal normal.  Left Ear: Tympanic membrane and ear canal normal.  Nose: Nose normal.  Mouth/Throat: Uvula is midline, oropharynx is clear and moist and mucous membranes are normal. No oropharyngeal exudate, posterior oropharyngeal edema, posterior oropharyngeal erythema or tonsillar abscesses.  Eyes: Pupils are equal, round, and reactive to light. Conjunctivae and EOM are normal.  Neck: Normal range of motion.  Cardiovascular: Normal rate, regular rhythm and normal heart sounds.  Pulmonary/Chest: Effort normal. No stridor. No respiratory distress. She has wheezes.  Diffuse expiratory wheezes, no acute distress, able to speak in full sentences without difficulty, O2 sats 98% during exam  Abdominal: Soft. Bowel sounds are normal. There is no tenderness. There is no rebound.  Musculoskeletal: Normal range of motion.  Neurological: She is alert and oriented to person, place, and time.  Skin:  Skin is warm and dry.  Psychiatric: She has a normal mood and affect.  Nursing note and vitals reviewed.    ED Treatments / Results  Labs (all labs ordered are listed, but only abnormal results are displayed) Labs Reviewed - No data to display  EKG None  Radiology Dg Chest 2 View  Result Date: 10/16/2017 CLINICAL DATA:  Shortness of breath and wheezing. EXAM: CHEST - 2 VIEW COMPARISON:  Chest radiograph July 25, 2017 FINDINGS: Cardiomediastinal silhouette is normal. No pleural effusions or focal consolidations. Trachea projects midline and there is no pneumothorax. Soft tissue planes and included osseous structures are non-suspicious. IMPRESSION: Negative. Electronically Signed   By: Awilda Metroourtnay  Bloomer M.D.   On: 10/16/2017 01:53    Procedures Procedures (including critical care time)  Medications Ordered in ED Medications  albuterol (PROVENTIL HFA;VENTOLIN HFA) 108 (90 Base) MCG/ACT inhaler 2 puff (has no administration in time range)  predniSONE (DELTASONE) tablet 60 mg (60 mg Oral Given 10/16/17 0210)  albuterol (PROVENTIL) (2.5 MG/3ML) 0.083% nebulizer solution 5 mg (5 mg Nebulization Given 10/16/17 0211)  ipratropium (ATROVENT) nebulizer solution 0.5 mg (0.5 mg Nebulization Given 10/16/17 0211)  albuterol (PROVENTIL) (2.5 MG/3ML) 0.083% nebulizer solution 5 mg (5 mg Nebulization Given 10/16/17 0300)     Initial Impression / Assessment and Plan / ED Course  I have reviewed the triage vital signs and the nursing notes.  Pertinent labs & imaging results that were available during my care of the patient were reviewed by me and considered in my medical decision making (see chart for details).  47 year old female presenting to the ED with URI type symptoms for the past week.  Denies chest pain.  No cardiac history.  She is afebrile and nontoxic.  Does have some expiratory wheezes on exam.  Vital signs are stable on room air.  Reports intermittent problems upper respiratory  infections over the past few months.  Will obtain chest x-ray.  Given dose of prednisone and albuterol/Atrovent neb.  Will reassess.  After neb lung sounds have improved but is not fully cleared.  Patient states she still feels somewhat wheezy.  Chest x-ray is clear.  Will give additional neb and can likely discharge home.  Will give her albuterol inhaler here in ED, continue prednisone taper at home.  She does not currently have PCP and is uninsured, will refer to wellness clinic for follow-up.  She understands to return here for any new or worsening symptoms.  Final Clinical Impressions(s) / ED Diagnoses   Final diagnoses:  Cough    ED Discharge Orders        Ordered    albuterol (PROVENTIL HFA;VENTOLIN HFA) 108 (  90 Base) MCG/ACT inhaler  Every 6 hours PRN     10/16/17 0344    benzonatate (TESSALON) 100 MG capsule  Every 8 hours     10/16/17 0344    predniSONE (DELTASONE) 20 MG tablet     10/16/17 0344       Garlon Hatchet, PA-C 10/16/17 0352    Molpus, Jonny Ruiz, MD 10/16/17 [phone]

## 2017-10-16 NOTE — Discharge Instructions (Addendum)
Take the prescribed medication as directed. Follow-up with the health and wellness clinic-- this is a free clinic! Call Monday for appt. Return to the ED for new or worsening symptoms.

## 2019-01-29 IMAGING — DX DG CHEST 2V
2 series · 2 of 2 positions shown · non-contrast
Comparison: 11/21/2013 chest radiograph

CLINICAL DATA: 46 y/o F; cough, congestion, runny nose, and
shortness of breath for 3-4 days. No fever.

EXAM:
CHEST  2 VIEW

[chest pa]
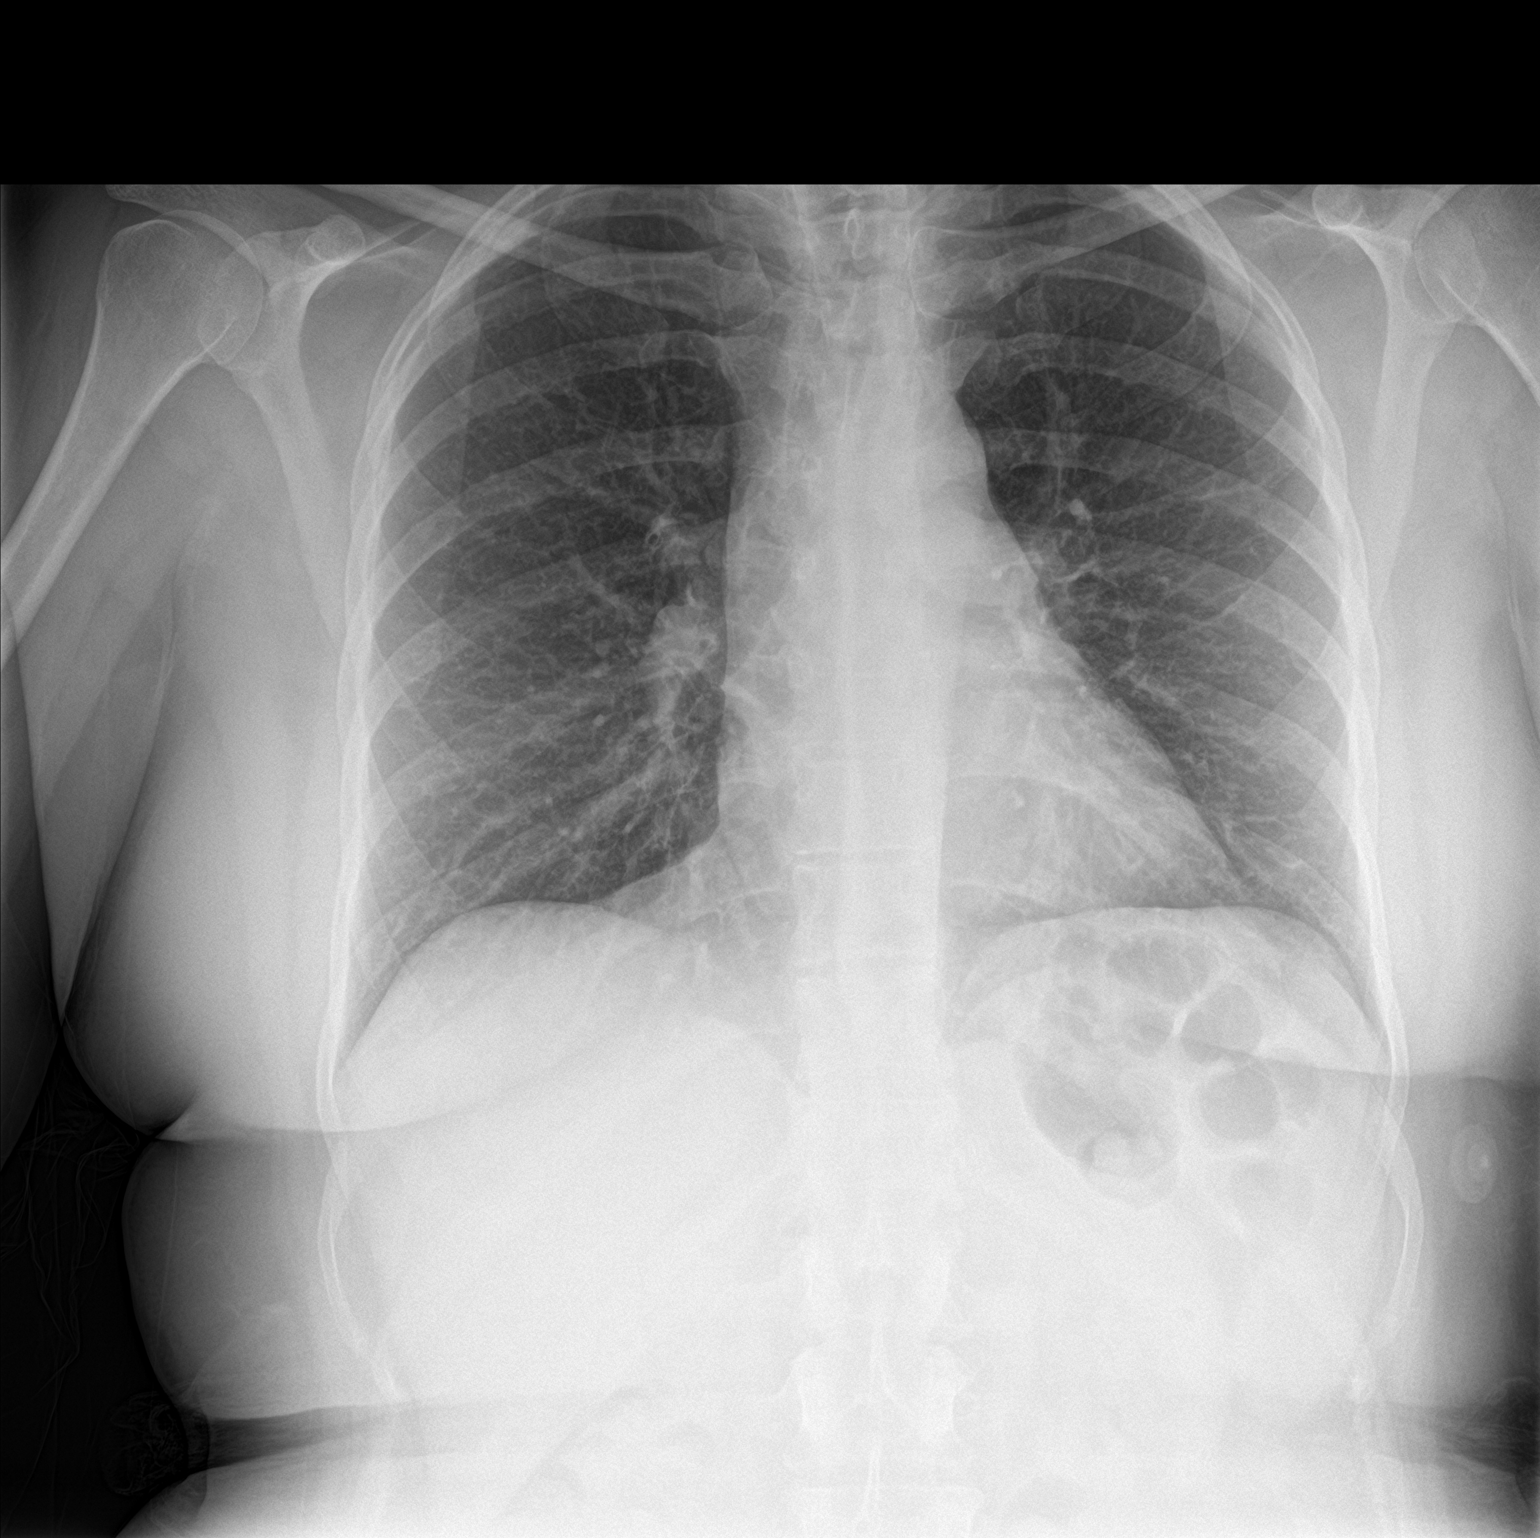

[chest lat]
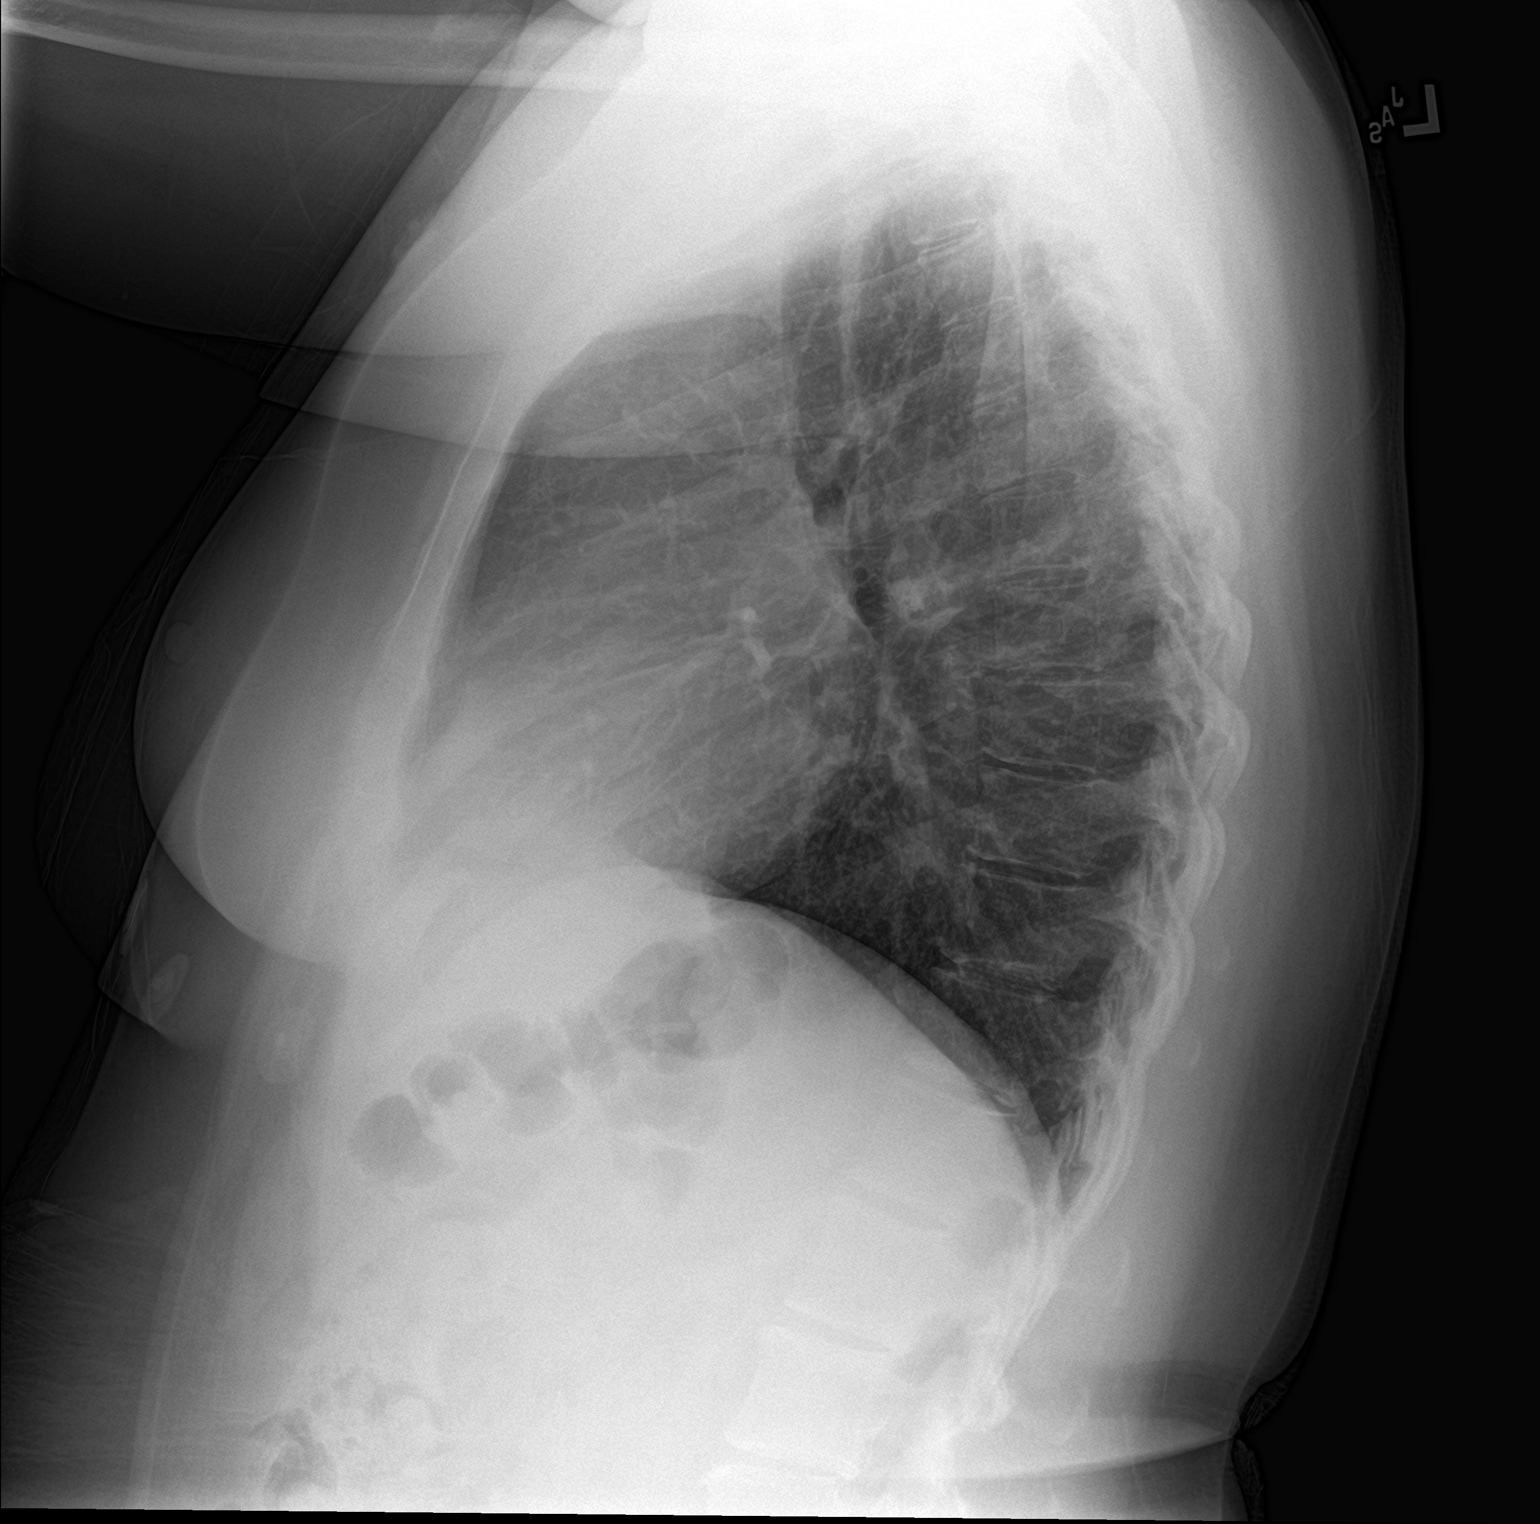

[2 of 2 positions shown; findings below may reference images not displayed]

FINDINGS: Stable heart size and mediastinal contours are within normal limits.
Both lungs are clear. The visualized skeletal structures are
unremarkable.
IMPRESSION: No acute pulmonary process identified.

By: Klever Jumper M.D.

## 2019-04-22 IMAGING — CR DG CHEST 2V
2 series · 2 of 2 positions shown · non-contrast
Comparison: Chest radiograph July 25, 2017

CLINICAL DATA: Shortness of breath and wheezing.

EXAM:
CHEST - 2 VIEW

[w chest pa]
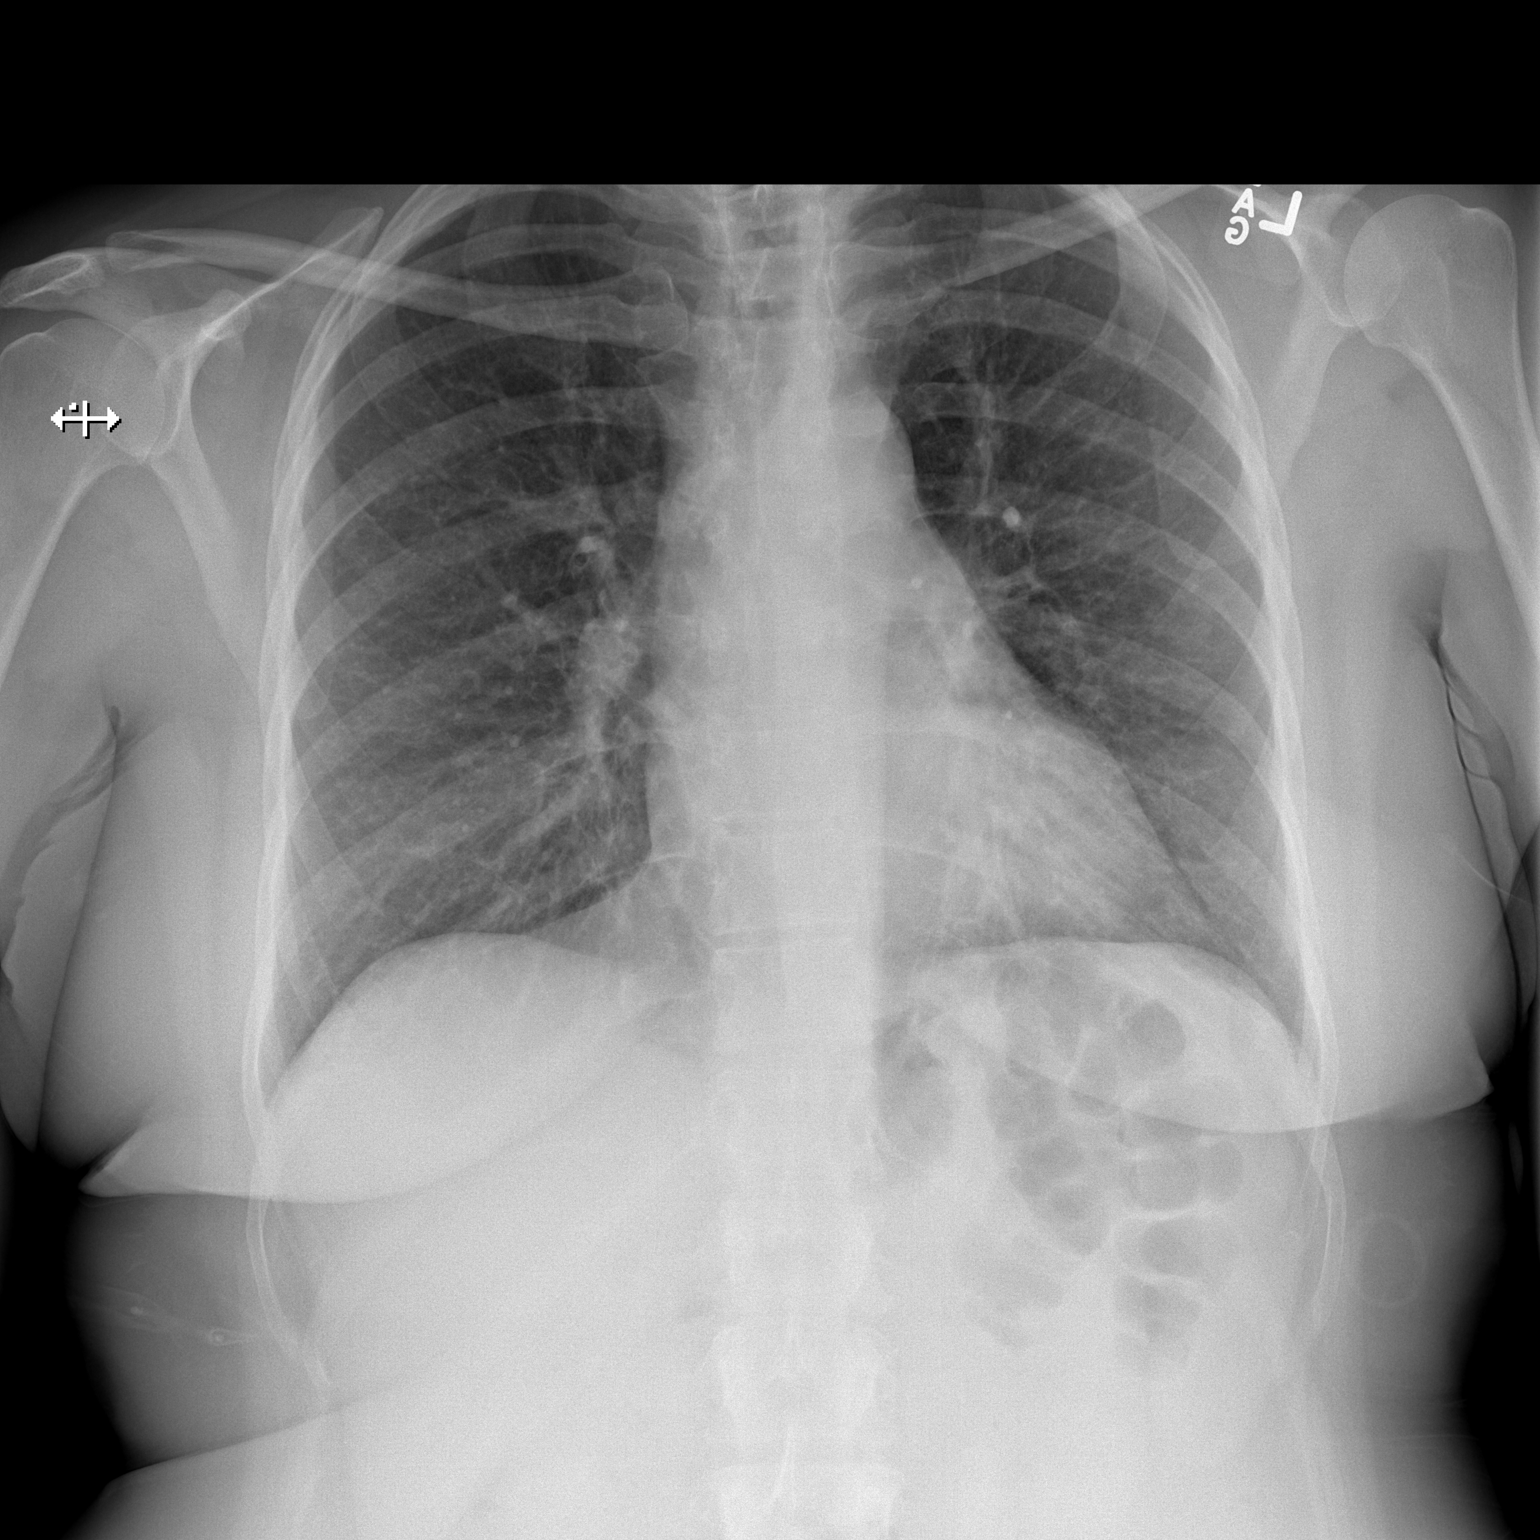

[w chest lat]
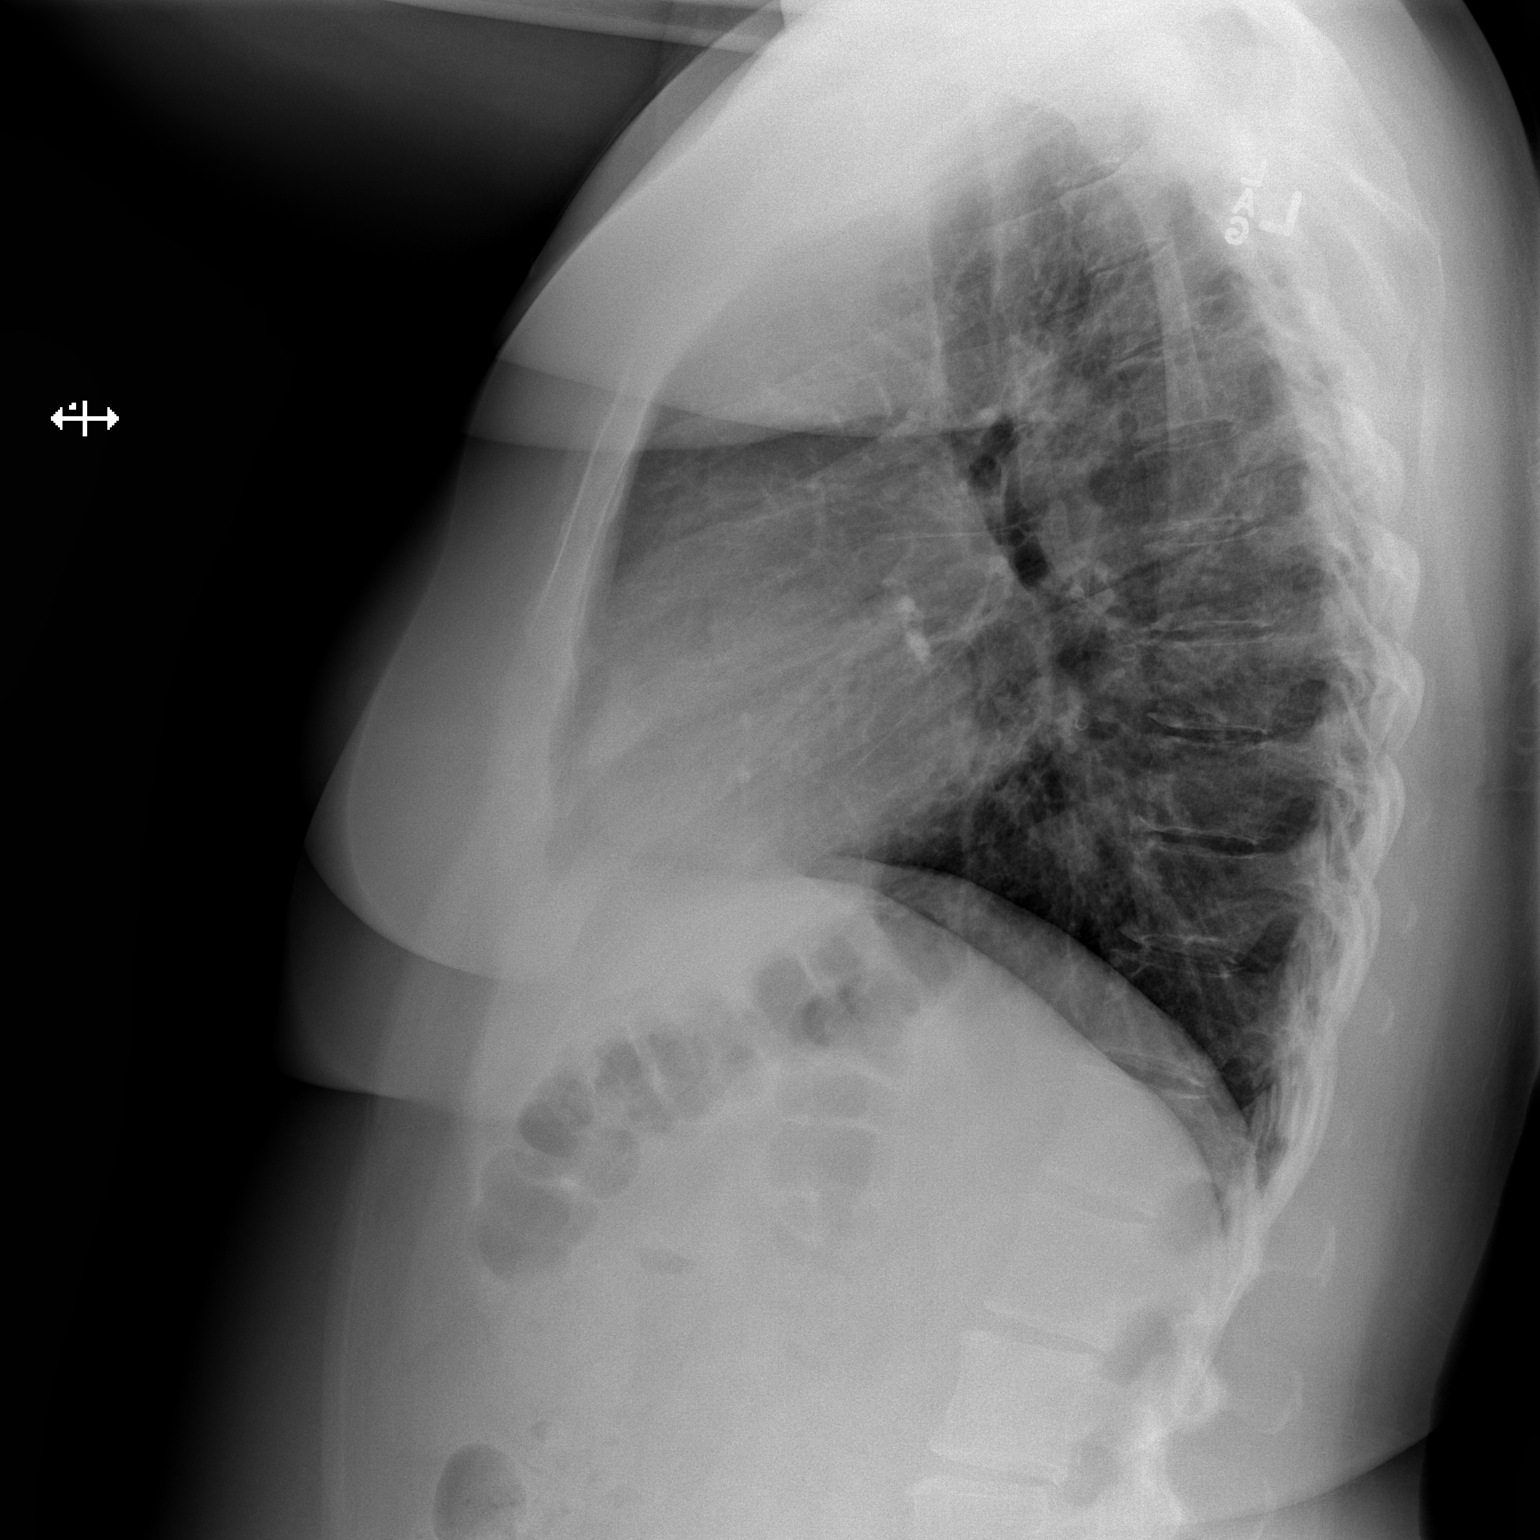

[2 of 2 positions shown; findings below may reference images not displayed]

FINDINGS: Cardiomediastinal silhouette is normal. No pleural effusions or
focal consolidations. Trachea projects midline and there is no
pneumothorax. Soft tissue planes and included osseous structures are
non-suspicious.
IMPRESSION: Negative.

## 2021-03-27 ENCOUNTER — Ambulatory Visit
Admission: EM | Admit: 2021-03-27 | Discharge: 2021-03-27 | Disposition: A | Discharge status: Home / Self Care | Payer: Self-pay | Attending: Physician Assistant | Admitting: Physician Assistant

## 2021-03-27 ENCOUNTER — Other Ambulatory Visit: Payer: Self-pay

## 2021-03-27 ENCOUNTER — Encounter: Payer: Self-pay | Admitting: Emergency Medicine

## 2021-03-27 DIAGNOSIS — R1030 Lower abdominal pain, unspecified: Secondary | ICD-10-CM | POA: Insufficient documentation

## 2021-03-27 DIAGNOSIS — N3001 Acute cystitis with hematuria: Secondary | ICD-10-CM | POA: Insufficient documentation

## 2021-03-27 DIAGNOSIS — R110 Nausea: Secondary | ICD-10-CM | POA: Insufficient documentation

## 2021-03-27 DIAGNOSIS — R300 Dysuria: Secondary | ICD-10-CM | POA: Insufficient documentation

## 2021-03-27 DIAGNOSIS — N3289 Other specified disorders of bladder: Secondary | ICD-10-CM | POA: Insufficient documentation

## 2021-03-27 LAB — POCT URINALYSIS DIP (MANUAL ENTRY)
Bilirubin, UA: NEGATIVE
Glucose, UA: NEGATIVE mg/dL
Ketones, POC UA: NEGATIVE mg/dL
Nitrite, UA: NEGATIVE
Protein Ur, POC: >=300 mg/dL — AB
Spec Grav, UA: 1.02 (ref 1.010–1.025)
Urobilinogen, UA: 1 E.U./dL
pH, UA: 8 (ref 5.0–8.0)

## 2021-03-27 MED ORDER — SULFAMETHOXAZOLE-TRIMETHOPRIM 800-160 MG PO TABS: 1.0000 | ORAL_TABLET | Freq: Two times a day (BID) | ORAL | 0 refills | Status: AC

## 2021-03-27 MED ORDER — CEFTRIAXONE SODIUM 1 G IJ SOLR
1.0000 g | Freq: Once | INTRAMUSCULAR | Status: AC
  Administered 2021-03-27: 1 g via INTRAMUSCULAR

## 2021-03-27 NOTE — ED Provider Notes (Signed)
[facility]    CSN: [id number] Arrival date & time: 03/27/21  1437      History   Chief Complaint Chief Complaint  Patient presents with   Dysuria    HPI Danielle Frost is a 50 y.o. female.   Patient presents today with a weeklong history of UTI symptoms.  Reports dysuria, urinary frequency, bladder spasms, lower abdominal pain, nausea.  Denies any vomiting, hematuria, chest pain, shortness of breath, flank pain, fever.  She does have a history of UTI and states current symptoms are similar but more severe than previous episodes of this condition.  She denies recurrent urinary tract infections or seeing urologist in the past.  Denies history of nephrolithiasis, single kidney, recent urogenital procedure, self-catheterization.  Denies any recent antibiotic use.  She has not tried any over-the-counter medication for symptom management.  She has been pushing fluids but this has not provided any relief of symptoms.   Past Medical History:  Diagnosis Date   GERD (gastroesophageal reflux disease)    Strep pharyngitis     There are no problems to display for this patient.   Past Surgical History:  Procedure Laterality Date   APPENDECTOMY     TUBAL LIGATION      OB History     Gravida  3   Para  3   Term  3   Preterm      AB      Living  3      SAB      IAB      Ectopic      Multiple      Live Births               Home Medications    Prior to Admission medications   Medication Sig Start Date End Date Taking? Authorizing Provider  sulfamethoxazole-trimethoprim (BACTRIM DS) 800-160 MG tablet Take 1 tablet by mouth 2 (two) times daily for 7 days. 03/27/21 04/03/21 Yes Antoninette Lerner K, PA-C  albuterol (PROVENTIL HFA;VENTOLIN HFA) 108 (90 Base) MCG/ACT inhaler Inhale 1-2 puffs into the lungs every 6 (six) hours as needed for wheezing. 10/16/17   Garlon Hatchet, PA-C  benzonatate (TESSALON) 100 MG capsule Take 1 capsule (100 mg total) by mouth  every 8 (eight) hours. 10/16/17   Garlon Hatchet, PA-C  Dextromethorphan-Guaifenesin (MUCINEX DM) 30-600 MG TB12 Take 1 tablet by mouth 2 (two) times daily. Patient not taking: Reported on 10/16/2017 08/18/17   Garlon Hatchet, PA-C  guaifenesin (ROBITUSSIN) 100 MG/5ML syrup Take 200 mg by mouth 3 (three) times daily as needed for cough.    [provider]  ibuprofen (ADVIL,MOTRIN) 200 MG tablet Take 600-800 mg by mouth every 6 (six) hours as needed for moderate pain.    [provider]  predniSONE (DELTASONE) 20 MG tablet Take 40 mg by mouth daily for 3 days, then 20mg  by mouth daily for 3 days, then 10mg  daily for 3 days 10/16/17   , PA-C    Family History History reviewed. No pertinent family history.  Social History Social History   Tobacco Use   Smoking status: Never   Smokeless tobacco: Never  Substance Use Topics   Alcohol use: No    Comment: occasional   Drug use: No     Allergies   Patient has no known allergies.   Review of Systems Review of Systems  Constitutional:  Positive for activity change. Negative for appetite change, fatigue and fever.  Respiratory:  Negative for cough and shortness of breath.   Cardiovascular:  Negative for chest pain.  Gastrointestinal:  Positive for abdominal pain and nausea. Negative for diarrhea and vomiting.  Genitourinary:  Positive for dysuria, frequency and urgency. Negative for flank pain, hematuria, pelvic pain, vaginal bleeding, vaginal discharge and vaginal pain.  Musculoskeletal:  Negative for arthralgias, back pain and myalgias.  Neurological:  Negative for dizziness, light-headedness and headaches.   Physical Exam Triage Vital Signs ED Triage Vitals  Enc Vitals Group     BP 03/27/21 1617 117/81     Pulse Rate 03/27/21 1617 96     Resp 03/27/21 1617 16     Temp 03/27/21 1617 98.1 F (36.7 C)     Temp Source 03/27/21 1617 Oral     SpO2 03/27/21 1617 96 %     Weight --      Height --       Head Circumference --      Peak Flow --      Pain Score 03/27/21 1516 3     Pain Loc --      Pain Edu? --      Excl. in GC? --    No data found.  Updated Vital Signs BP 117/81 (BP Location: Left Arm)   Pulse 96   Temp 98.1 F (36.7 C) (Oral)   Resp 16   LMP 08/20/2015 (Within Days)   SpO2 96%   Visual Acuity Right Eye Distance:   Left Eye Distance:   Bilateral Distance:    Right Eye Near:   Left Eye Near:    Bilateral Near:     Physical Exam Vitals reviewed.  Constitutional:      General: She is awake. She is not in acute distress.    Appearance: Normal appearance. She is normal weight. She is not ill-appearing.     Comments: Very pleasant female appears stated age no acute distress sitting comfortably in exam room  HENT:     Head: Normocephalic and atraumatic.  Cardiovascular:     Rate and Rhythm: Normal rate and regular rhythm.     Heart sounds: Normal heart sounds, S1 normal and S2 normal. No murmur heard. Pulmonary:     Effort: Pulmonary effort is normal.     Breath sounds: Normal breath sounds. No wheezing, rhonchi or rales.     Comments: Clear to auscultation bilaterally Abdominal:     General: Bowel sounds are normal.     Palpations: Abdomen is soft.     Tenderness: There is no abdominal tenderness. There is no right CVA tenderness, left CVA tenderness, guarding or rebound.     Comments: Benign abdominal exam; nontender to palpation.  No CVA tenderness.  Genitourinary:    Comments: Exam deferred Psychiatric:        Behavior: Behavior is cooperative.     UC Treatments / Results  Labs (all labs ordered are listed, but only abnormal results are displayed) Labs Reviewed  POCT URINALYSIS DIP (MANUAL ENTRY) - Abnormal; Notable for the following components:      Result Value   Color, UA brown (*)    Clarity, UA cloudy (*)    Blood, UA large (*)    Protein Ur, POC >=300 (*)    Leukocytes, UA Small (1+) (*)    All other components within normal limits   URINE CULTURE    EKG   Radiology No results found.  Procedures Procedures (including critical care time)  Medications Ordered in UC  Medications  cefTRIAXone (ROCEPHIN) injection 1 g (has no administration in time range)    Initial Impression / Assessment and Plan / UC Course  I have reviewed the triage vital signs and the nursing notes.  Pertinent labs & imaging results that were available during my care of the patient were reviewed by me and considered in my medical decision making (see chart for details).      UA showed evidence of infection.  No evidence of pyelonephritis on clinical presentation.  Patient was given injection of Rocephin given severity of symptoms.  She was started on Bactrim DS with instruction to stop the medication and be seen immediately if she develops any side effects including rash or oral lesions.  Recommend that she rest and drink plenty of fluid.  Discussed that she will need to have urine repeated within a few weeks to ensure clearing of hematuria noted today.  Discussed alarm symptoms that warrant emergent evaluation.  Strict return precautions given to which patient expressed understanding.  Final Clinical Impressions(s) / UC Diagnoses   Final diagnoses:  Acute cystitis with hematuria  Dysuria  Bladder spasm  Lower abdominal pain  Nausea without vomiting     Discharge Instructions      We gave you a shot of antibiotics.  Please start Bactrim DS twice a day for a week.  We will contact you if need to change antibiotics based on your culture results.  Make sure you are drinking plenty of fluid.  If you have any worsening symptoms including abdominal pain, pelvic pain, back pain, fever, nausea, vomiting you need to be seen immediately.  I do recommend that you follow-up with either our clinic or your primary care provider within a few weeks to recheck your urine to make sure blood that was noted today resolves.     ED Prescriptions      Medication Sig Dispense Auth. Provider   sulfamethoxazole-trimethoprim (BACTRIM DS) 800-160 MG tablet Take 1 tablet by mouth 2 (two) times daily for 7 days. 14 tablet Ziara Thelander, Noberto Retort, PA-C      PDMP not reviewed this encounter.   Jeani Hawking, PA-C 03/27/21 1647

## 2021-03-27 NOTE — ED Triage Notes (Signed)
Dysuria, frequency, hesitancy, abdominal pressure x 1 week

## 2021-03-27 NOTE — Discharge Instructions (Addendum)
We gave you a shot of antibiotics.  Please start Bactrim DS twice a day for a week.  We will contact you if need to change antibiotics based on your culture results.  Make sure you are drinking plenty of fluid.  If you have any worsening symptoms including abdominal pain, pelvic pain, back pain, fever, nausea, vomiting you need to be seen immediately.  I do recommend that you follow-up with either our clinic or your primary care provider within a few weeks to recheck your urine to make sure blood that was noted today resolves.

## 2021-03-29 LAB — URINE CULTURE: Culture: >=100000 — AB

## 2021-09-21 ENCOUNTER — Encounter: Payer: Self-pay | Admitting: Emergency Medicine

## 2021-09-21 ENCOUNTER — Other Ambulatory Visit: Payer: Self-pay

## 2021-09-21 ENCOUNTER — Ambulatory Visit
Admission: EM | Admit: 2021-09-21 | Discharge: 2021-09-21 | Disposition: A | Discharge status: Home / Self Care | Payer: Self-pay | Attending: Internal Medicine | Admitting: Internal Medicine

## 2021-09-21 DIAGNOSIS — M79642 Pain in left hand: Secondary | ICD-10-CM

## 2021-09-21 MED ORDER — IBUPROFEN 600 MG PO TABS: 600.0000 mg | ORAL_TABLET | Freq: Four times a day (QID) | ORAL | 0 refills | Status: DC | PRN

## 2021-09-21 NOTE — ED Triage Notes (Signed)
Patient c/o left hand/thumb/wrist pain x 3 days.  No injury to the area.  Patient does have a history of bursitis and unsure if this is what's going on or not.  Patient has taken Ibuprofen for this discomfort. ?

## 2021-09-21 NOTE — ED Provider Notes (Signed)
?[facility] ? ? ? ?CSN: [id number] ?Arrival date & time: 09/21/21  5009 ? ? ?  ? ?History   ?Chief Complaint ?Chief Complaint  ?Patient presents with  ? Hand Pain  ? ? ?HPI ?Danielle Frost is a 51 y.o. female.  ? ?Patient presents with left hand/left thumb/left wrist pain that has been present for 3 days.  Denies any apparent injury.  Denies history of chronic left upper extremity pain.  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  Patient has taken ibuprofen for pain with minimal improvement.  Denies any numbness or tingling. ? ? ?Hand Pain ? ? ?Past Medical History:  ?Diagnosis Date  ? GERD (gastroesophageal reflux disease)   ? Strep pharyngitis   ? ? ?There are no problems to display for this patient. ? ? ?Past Surgical History:  ?Procedure Laterality Date  ? APPENDECTOMY    ? TUBAL LIGATION    ? ? ?OB History   ? ? Gravida  ?3  ? Para  ?3  ? Term  ?3  ? Preterm  ?   ? AB  ?   ? Living  ?3  ?  ? ? SAB  ?   ? IAB  ?   ? Ectopic  ?   ? Multiple  ?   ? Live Births  ?   ?   ?  ?  ? ? ? ?Home Medications   ? ?Prior to Admission medications   ?Medication Sig Start Date End Date Taking? Authorizing Provider  ?ibuprofen (ADVIL) 600 MG tablet Take 1 tablet (600 mg total) by mouth every 6 (six) hours as needed for mild pain. 09/21/21  Yes Allahna Husband, Acie Fredrickson, FNP  ?albuterol (PROVENTIL HFA;VENTOLIN HFA) 108 (90 Base) MCG/ACT inhaler Inhale 1-2 puffs into the lungs every 6 (six) hours as needed for wheezing. 10/16/17   Garlon Hatchet, PA-C  ?benzonatate (TESSALON) 100 MG capsule Take 1 capsule (100 mg total) by mouth every 8 (eight) hours. 10/16/17   Garlon Hatchet, PA-C  ?Dextromethorphan-Guaifenesin (MUCINEX DM) 30-600 MG TB12 Take 1 tablet by mouth 2 (two) times daily. ?Patient not taking: Reported on 10/16/2017 08/18/17   Garlon Hatchet, PA-C  ?guaifenesin (ROBITUSSIN) 100 MG/5ML syrup Take 200 mg by mouth 3 (three) times  daily as needed for cough.    [provider]  ? ? ?Family History ?History reviewed. No pertinent family history. ? ?Social History ?Social History  ? ?Tobacco Use  ? Smoking status: Never  ? Smokeless tobacco: Never  ?Substance Use Topics  ? Alcohol use: No  ?  Comment: occasional  ? Drug use: No  ? ? ? ?Allergies   ?Patient has no known allergies. ? ? ?Review of Systems ?Review of Systems ?Per HPI ? ?Physical Exam ?Triage Vital Signs ?ED Triage Vitals [09/21/21 0908]  ?Enc Vitals Group  ?   BP 139/88  ?   Pulse Rate 74  ?   Resp 18  ?   Temp 97.9 ?F (36.6 ?C)  ?   Temp Source Oral  ?   SpO2 98 %  ?   Weight 190 lb (86.2 kg)  ?   Height 5\' 5"  (1.651 m)  ?   Head Circumference   ?   Peak Flow   ?   Pain Score 4  ?   Pain Loc   ?  Pain Edu?   ?   Excl. in GC?   ? ?No data found. ? ?Updated Vital Signs ?BP 139/88 (BP Location: Left Arm)   Pulse 74   Temp 97.9 ?F (36.6 ?C) (Oral)   Resp 18   Ht 5\' 5"  (1.651 m)   Wt 190 lb (86.2 kg)   LMP 08/20/2015 (Within Days)   SpO2 98%   BMI 31.62 kg/m?  ? ?Visual Acuity ?Right Eye Distance:   ?Left Eye Distance:   ?Bilateral Distance:   ? ?Right Eye Near:   ?Left Eye Near:    ?Bilateral Near:    ? ?Physical Exam ?Constitutional:   ?   General: She is not in acute distress. ?   Appearance: Normal appearance. She is not toxic-appearing or diaphoretic.  ?HENT:  ?   Head: Normocephalic and atraumatic.  ?Eyes:  ?   Extraocular Movements: Extraocular movements intact.  ?   Conjunctiva/sclera: Conjunctivae normal.  ?Pulmonary:  ?   Effort: Pulmonary effort is normal.  ?Musculoskeletal:  ?   Right wrist: Normal.  ?   Left wrist: Normal.  ?   Right hand: Normal.  ?   Left hand: Tenderness present. No swelling, deformity or bony tenderness. Normal range of motion. Normal strength. Normal sensation. There is no disruption of two-point discrimination. Normal capillary refill. Normal pulse.  ?   Comments: Tenderness to palpation at left proximal left thumb directly above  distal radius. No obvious swelling, erythema, bruising. Grip strength 5/5. Neurovascular intact.   ?Neurological:  ?   General: No focal deficit present.  ?   Mental Status: She is alert and oriented to person, place, and time. Mental status is at baseline.  ?Psychiatric:     ?   Mood and Affect: Mood normal.     ?   Behavior: Behavior normal.     ?   Thought Content: Thought content normal.     ?   Judgment: Judgment normal.  ? ? ? ?UC Treatments / Results  ?Labs ?(all labs ordered are listed, but only abnormal results are displayed) ?Labs Reviewed - No data to display ? ?EKG ? ? ?Radiology ?No results found. ? ?Procedures ?Procedures (including critical care time) ? ?Medications Ordered in UC ?Medications - No data to display ? ?Initial Impression / Assessment and Plan / UC Course  ?I have reviewed the triage vital signs and the nursing notes. ? ?Pertinent labs & imaging results that were available during my care of the patient were reviewed by me and considered in my medical decision making (see chart for details). ? ?  ? ?Differential diagnoses include arthritis versus de Quervain's tenosynovitis.  Will treat with NSAIDs as well as ice application and thumb spica splint.  Do not think that imaging is necessary given no apparent injury.  Patient to follow-up with orthopedist at provided contact information if pain persists or worsens.  Discussed return precautions.  Patient verbalized understanding and was agreeable with plan. ?Final Clinical Impressions(s) / UC Diagnoses  ? ?Final diagnoses:  ?Left hand pain  ? ? ? ?Discharge Instructions   ? ?  ?It appears that you have an inflammation of your left hand that could be related to arthritis or tendon inflammation.  You have been prescribed ibuprofen to decrease inflammation and pain.  A wrist brace has also been applied.  Please follow-up with orthopedist if pain persists or worsens. ? ? ? ?ED Prescriptions   ? ? Medication Sig Dispense Auth. Provider  ? ibuprofen  (  ADVIL) 600 MG tablet Take 1 tablet (600 mg total) by mouth every 6 (six) hours as needed for mild pain. 30 tablet Ervin Knack E, [place]  ? ?  ? ?PDMP not reviewed this encounter. ?  ?Gustavus Bryant, [place] ?09/21/21 7672 ? ?

## 2021-09-21 NOTE — Discharge Instructions (Signed)
It appears that you have an inflammation of your left hand that could be related to arthritis or tendon inflammation.  You have been prescribed ibuprofen to decrease inflammation and pain.  A wrist brace has also been applied.  Please follow-up with orthopedist if pain persists or worsens. ?

## 2022-03-15 DIAGNOSIS — Z0000 Encounter for general adult medical examination without abnormal findings: Secondary | ICD-10-CM | POA: Diagnosis not present

## 2022-03-15 DIAGNOSIS — Z131 Encounter for screening for diabetes mellitus: Secondary | ICD-10-CM | POA: Diagnosis not present

## 2022-03-15 DIAGNOSIS — I10 Essential (primary) hypertension: Secondary | ICD-10-CM | POA: Diagnosis not present

## 2022-03-15 DIAGNOSIS — Z13220 Encounter for screening for lipoid disorders: Secondary | ICD-10-CM | POA: Diagnosis not present

## 2022-03-15 DIAGNOSIS — E669 Obesity, unspecified: Secondary | ICD-10-CM | POA: Diagnosis not present

## 2022-03-15 DIAGNOSIS — Z720 Tobacco use: Secondary | ICD-10-CM | POA: Diagnosis not present

## 2022-03-29 DIAGNOSIS — I83813 Varicose veins of bilateral lower extremities with pain: Secondary | ICD-10-CM | POA: Diagnosis not present

## 2022-03-29 DIAGNOSIS — Z720 Tobacco use: Secondary | ICD-10-CM | POA: Diagnosis not present

## 2022-03-29 DIAGNOSIS — I10 Essential (primary) hypertension: Secondary | ICD-10-CM | POA: Diagnosis not present

## 2022-03-29 DIAGNOSIS — M25562 Pain in left knee: Secondary | ICD-10-CM | POA: Diagnosis not present

## 2022-03-29 DIAGNOSIS — E669 Obesity, unspecified: Secondary | ICD-10-CM | POA: Diagnosis not present

## 2022-04-26 ENCOUNTER — Other Ambulatory Visit: Payer: Self-pay | Admitting: *Deleted

## 2022-04-26 DIAGNOSIS — I83813 Varicose veins of bilateral lower extremities with pain: Secondary | ICD-10-CM

## 2022-05-03 NOTE — Progress Notes (Unsigned)
Requested by:  Trey Sailors, PA [address] [place],  [place] [address]  Reason for consultation: ***    History of Present Illness   Danielle Frost is a 51 y.o. ([birth year]) female who presents for evaluation of ***  Venous symptoms include: (aching, heavy, tired, throbbing, burning, itching, swelling, bleeding, ulcer)  *** Onset/duration:  ***  Occupation:  *** Aggravating factors: (sitting, standing) Alleviating factors: (elevation) Compression:  *** Helps:  *** Pain medications:  *** Previous vein procedures:  *** History of DVT:  ***  Past Medical History:  Diagnosis Date   GERD (gastroesophageal reflux disease)    Strep pharyngitis     Past Surgical History:  Procedure Laterality Date   APPENDECTOMY     TUBAL LIGATION      Social History   Socioeconomic History   Marital status: Single    Spouse name: Not on file   Number of children: Not on file   Years of education: Not on file   Highest education level: Not on file  Occupational History   Not on file  Tobacco Use   Smoking status: Never   Smokeless tobacco: Never  Substance and Sexual Activity   Alcohol use: No    Comment: occasional   Drug use: No   Sexual activity: Yes    Birth control/protection: Surgical  Other Topics Concern   Not on file  Social History Narrative   Not on file   Social Determinants of Health   Financial Resource Strain: Not on file  Food Insecurity: Not on file  Transportation Needs: Not on file  Physical Activity: Not on file  Stress: Not on file  Social Connections: Not on file  Intimate Partner Violence: Not on file   ***No family history on file.  Current Outpatient Medications  Medication Sig Dispense Refill   albuterol (PROVENTIL HFA;VENTOLIN HFA) 108 (90 Base) MCG/ACT inhaler Inhale 1-2 puffs into the lungs every 6 (six) hours as needed for wheezing. 1 Inhaler 0   benzonatate (TESSALON) 100 MG capsule Take 1 capsule (100 mg total) by mouth  every 8 (eight) hours. 21 capsule 0   Dextromethorphan-Guaifenesin (MUCINEX DM) 30-600 MG TB12 Take 1 tablet by mouth 2 (two) times daily. (Patient not taking: Reported on 10/16/2017) 14 each 0   guaifenesin (ROBITUSSIN) 100 MG/5ML syrup Take 200 mg by mouth 3 (three) times daily as needed for cough.     ibuprofen (ADVIL) 600 MG tablet Take 1 tablet (600 mg total) by mouth every 6 (six) hours as needed for mild pain. 30 tablet 0   No current facility-administered medications for this visit.    No Known Allergies  ***REVIEW OF SYSTEMS (negative unless checked):   Cardiac:  []  Chest pain or chest pressure? []  Shortness of breath upon activity? []  Shortness of breath when lying flat? []  Irregular heart rhythm?  Vascular:  []  Pain in calf, thigh, or hip brought on by walking? []  Pain in feet at night that wakes you up from your sleep? []  Blood clot in your veins? []  Leg swelling?  Pulmonary:  []  Oxygen at home? []  Productive cough? []  Wheezing?  Neurologic:  []  Sudden weakness in arms or legs? []  Sudden numbness in arms or legs? []  Sudden onset of difficult speaking or slurred speech? []  Temporary loss of vision in one eye? []  Problems with dizziness?  Gastrointestinal:  []  Blood in stool? []  Vomited blood?  Genitourinary:  []  Burning when urinating? []  Blood in urine?  Psychiatric:  []  Major depression  Hematologic:  []  Bleeding problems? []  Problems with blood clotting?  Dermatologic:  []  Rashes or ulcers?  Constitutional:  []  Fever or chills?  Ear/Nose/Throat:  []  Change in hearing? []  Nose bleeds? []  Sore throat?  Musculoskeletal:  []  Back pain? []  Joint pain? []  Muscle pain?   Physical Examination    There were no vitals filed for this visit. There is no height or weight on file to calculate BMI.  General:  WDWN in NAD; vital signs documented above Gait: Not observed HENT: WNL, normocephalic Pulmonary: normal non-labored breathing , without  Rales, rhonchi,  wheezing Cardiac: {Desc; regular/irreg:14544} HR, without  Murmurs {With/Without:20273} carotid bruit*** Abdomen: soft, NT, no masses Skin: {With/Without:20273} rashes Vascular Exam/Pulses:  Right Left  Radial {Exam; arterial pulse strength 0-4:30167} {Exam; arterial pulse strength 0-4:30167}  Ulnar {Exam; arterial pulse strength 0-4:30167} {Exam; arterial pulse strength 0-4:30167}  Femoral {Exam; arterial pulse strength 0-4:30167} {Exam; arterial pulse strength 0-4:30167}  Popliteal {Exam; arterial pulse strength 0-4:30167} {Exam; arterial pulse strength 0-4:30167}  DP {Exam; arterial pulse strength 0-4:30167} {Exam; arterial pulse strength 0-4:30167}  PT {Exam; arterial pulse strength 0-4:30167} {Exam; arterial pulse strength 0-4:30167}   Extremities: {With/Without:20273} varicose veins, {With/Without:20273} reticular veins, {With/Without:20273} edema, {With/Without:20273} stasis pigmentation, {With/Without:20273} lipodermatosclerosis, {With/Without:20273} ulcers Musculoskeletal: no muscle wasting or atrophy  Neurologic: A&O X 3;  No focal weakness or paresthesias are detected Psychiatric:  The pt has {Desc; normal/abnormal:11317::"Normal"} affect.  Non-invasive Vascular Imaging   BLE Venous Insufficiency Duplex (***):  RLE:  *** DVT and SVT,  *** GSV reflux ***, GSV diameter *** *** SSV reflux ***, *** deep venous reflux  LLE: *** DVT and SVT,  *** GSV reflux ***,  GSV diameter *** *** SSV reflux ***, *** deep venous reflux   Medical Decision Making   REATA PETROV is a 51 y.o. female who presents with: ***LE chronic venous insufficiency, ***varicose veins with complications  Based on the patient's history and examination, I recommend: ***. I discussed with the patient the use of her 20-30 mm thigh high compression stockings and need for 3 month trial of such. The patient will follow up in 3 months with Dr. Thank you for allowing to  participate in this patient's care.   , PA-C [facility] Office: [phone]  05/03/2022, 11:02 AM  Clinic MD: 

## 2022-05-04 ENCOUNTER — Ambulatory Visit (INDEPENDENT_AMBULATORY_CARE_PROVIDER_SITE_OTHER): Payer: Self-pay | Admitting: Physician Assistant

## 2022-05-04 ENCOUNTER — Ambulatory Visit (HOSPITAL_COMMUNITY)
Admission: RE | Admit: 2022-05-04 | Discharge: 2022-05-04 | Disposition: A | Discharge status: Home / Self Care | Payer: 59 | Source: Ambulatory Visit | Attending: Vascular Surgery | Admitting: Vascular Surgery

## 2022-05-04 VITALS — BP 147/97 | HR 98 | Temp 98.6°F | Resp 20 | Ht 65.0 in | Wt 213.7 lb

## 2022-05-04 DIAGNOSIS — I83813 Varicose veins of bilateral lower extremities with pain: Secondary | ICD-10-CM | POA: Diagnosis not present

## 2024-02-16 ENCOUNTER — Ambulatory Visit
Admission: RE | Admit: 2024-02-16 | Discharge: 2024-02-16 | Disposition: A | Discharge status: Home / Self Care | Payer: Self-pay | Source: Ambulatory Visit | Attending: Family Medicine | Admitting: Family Medicine

## 2024-02-16 VITALS — BP 143/87 | HR 79 | Temp 98.1°F | Resp 18 | Wt 227.0 lb

## 2024-02-16 DIAGNOSIS — R030 Elevated blood-pressure reading, without diagnosis of hypertension: Secondary | ICD-10-CM

## 2024-02-16 DIAGNOSIS — R600 Localized edema: Secondary | ICD-10-CM

## 2024-02-16 LAB — POCT URINE DIPSTICK
Bilirubin, UA: NEGATIVE
Blood, UA: NEGATIVE
Glucose, UA: NEGATIVE mg/dL
Ketones, POC UA: NEGATIVE mg/dL
Leukocytes, UA: NEGATIVE
Nitrite, UA: NEGATIVE
POC PROTEIN,UA: NEGATIVE
Spec Grav, UA: 1.025 (ref 1.010–1.025)
Urobilinogen, UA: 4 U/dL — AB
pH, UA: 6 (ref 5.0–8.0)

## 2024-02-16 MED ORDER — FUROSEMIDE 20 MG PO TABS: ORAL_TABLET | ORAL | 0 refills | Status: AC

## 2024-02-16 NOTE — ED Triage Notes (Signed)
 I think it's my blood pressure - Entered by patient  Pt presents c/o leg swelling in both legs x 2 days. Pt states her legs feel tight and it's hard to stand after a period of time. Pt denies any additional sxs.

## 2024-02-16 NOTE — Discharge Instructions (Addendum)
Swelling happens when fluid collects in small spaces around tissues and organs inside the body. Another word for swelling is "edema." Some common parts of the body where people can have swelling are the lower legs or hands. This typically is worse in the areas of the body that are closest to the ground (because of gravity)  Symptoms of swelling can include puffiness of the skin, which can cause the skin to look stretched and shiny. This often occurs with swelling in the lower legs and can be worse after you sit or stand for a long time.  Treatment of edema includes several components: treatment of the underlying cause (if possible), reducing the amount of salt (sodium) in your diet, and, in many cases, use of a medication called a diuretic to eliminate excess fluid. Using compression stockings and elevating the legs may also be recommended.   You have had labs (blood tests) sent today. We will call you with any significant abnormalities or if there is need to begin or change treatment or pursue further follow up.  You may also review your test results online through MyChart. If you do not have a MyChart account, instructions to sign up should be on your discharge paperwork.

## 2024-02-17 ENCOUNTER — Ambulatory Visit (HOSPITAL_COMMUNITY): Payer: Self-pay

## 2024-02-17 LAB — COMPREHENSIVE METABOLIC PANEL WITH GFR
ALT: 22 IU/L (ref 0–32)
AST: 16 IU/L (ref 0–40)
Albumin: 3.9 g/dL (ref 3.8–4.9)
Alkaline Phosphatase: 97 IU/L (ref 44–121)
BUN/Creatinine Ratio: 12 (ref 9–23)
BUN: 10 mg/dL (ref 6–24)
Bilirubin Total: 0.2 mg/dL (ref 0.0–1.2)
CO2: 20 mmol/L (ref 20–29)
Calcium: 9.3 mg/dL (ref 8.7–10.2)
Chloride: 105 mmol/L (ref 96–106)
Creatinine, Ser: 0.83 mg/dL (ref 0.57–1.00)
Globulin, Total: 3.1 g/dL (ref 1.5–4.5)
Glucose: 124 mg/dL — ABNORMAL HIGH (ref 70–99)
Potassium: 3.8 mmol/L (ref 3.5–5.2)
Sodium: 141 mmol/L (ref 134–144)
Total Protein: 7 g/dL (ref 6.0–8.5)
eGFR: 84 mL/min/1.73 (ref >59)

## 2024-02-17 LAB — CBC
Hematocrit: 36.6 % (ref 34.0–46.6)
Hemoglobin: 12 g/dL (ref 11.1–15.9)
MCH: 28.8 pg (ref 26.6–33.0)
MCHC: 32.8 g/dL (ref 31.5–35.7)
MCV: 88 fL (ref 79–97)
Platelets: 258 x10E3/uL (ref 150–450)
RBC: 4.17 x10E6/uL (ref 3.77–5.28)
RDW: 13.7 % (ref 11.7–15.4)
WBC: 4.3 x10E3/uL (ref 3.4–10.8)

## 2024-02-17 LAB — BRAIN NATRIURETIC PEPTIDE: BNP: 31.6 pg/mL (ref 0.0–100.0)

## 2024-02-21 NOTE — ED Provider Notes (Signed)
 [facility]   [id number] 02/16/24 Arrival Time: 1341  ASSESSMENT & PLAN:  1. Bilateral lower extremity edema   2. Elevated blood pressure reading in office without diagnosis of hypertension    Unclear etiology of edema. No significant lab abnormalities. No susp for bilat DVT.  Results for orders placed or performed during the hospital encounter of 02/16/24  POCT URINE DIPSTICK   Collection Time: 02/16/24  2:28 PM  Result Value Ref Range   Color, UA yellow yellow   Clarity, UA clear clear   Glucose, UA negative negative mg/dL   Bilirubin, UA negative negative   Ketones, POC UA negative negative mg/dL   Spec Grav, UA 8.974 8.989 - 1.025   Blood, UA negative negative   pH, UA 6.0 5.0 - 8.0   POC PROTEIN,UA negative negative, trace   Urobilinogen, UA 4.0 (A) 0.2 or 1.0 E.U./dL   Nitrite, UA Negative Negative   Leukocytes, UA Negative Negative  CBC   Collection Time: 02/16/24  2:49 PM  Result Value Ref Range   WBC 4.3 3.4 - 10.8 x10E3/uL   RBC 4.17 3.77 - 5.28 x10E6/uL   Hemoglobin 12.0 11.1 - 15.9 g/dL   Hematocrit 63.3 65.9 - 46.6 %   MCV 88 79 - 97 fL   MCH 28.8 26.6 - 33.0 pg   MCHC 32.8 31.5 - 35.7 g/dL   RDW 86.2 88.2 - 84.5 %   Platelets 258 150 - 450 x10E3/uL  Comprehensive metabolic panel with GFR   Collection Time: 02/16/24  2:49 PM  Result Value Ref Range   Glucose 124 (H) 70 - 99 mg/dL   BUN 10 6 - 24 mg/dL   Creatinine, Ser 9.16 0.57 - 1.00 mg/dL   eGFR 84 >40 fO/fpw/8.26   BUN/Creatinine Ratio 12 9 - 23   Sodium 141 134 - 144 mmol/L   Potassium 3.8 3.5 - 5.2 mmol/L   Chloride 105 96 - 106 mmol/L   CO2 20 20 - 29 mmol/L   Calcium 9.3 8.7 - 10.2 mg/dL   Total Protein 7.0 6.0 - 8.5 g/dL   Albumin 3.9 3.8 - 4.9 g/dL   Globulin, Total 3.1 1.5 - 4.5 g/dL   Bilirubin Total 0.2 0.0 - 1.2 mg/dL   Alkaline Phosphatase 97 44 - 121 IU/L   AST 16 0 - 40 IU/L   ALT 22 0 - 32 IU/L  Brain natriuretic peptide   Collection Time: 02/16/24  2:49 PM   Result Value Ref Range   BNP 31.6 0.0 - 100.0 pg/mL   Trial of: Meds ordered this encounter  Medications   furosemide  (LASIX ) 20 MG tablet    Sig: Take 1-2 tablets in the morning as needed for leg swelling.    Dispense:  20 tablet    Refill:  0     Follow-up Information     Schedule an appointment as soon as possible for a visit  with Rosalea Rosina SAILOR, PA.   Specialty: Physician Assistant Why: To recheck your blood pressure and to talk about your leg swelling. Contact information: [address] [place] [address] [phone]                  Discharge Instructions      Swelling happens when fluid collects in small spaces around tissues and organs inside the body. Another word for swelling is edema. Some common parts of the body where people can have swelling are the lower legs or hands. This typically is  worse in the areas of the body that are closest to the ground (because of gravity)  Symptoms of swelling can include puffiness of the skin, which can cause the skin to look stretched and shiny. This often occurs with swelling in the lower legs and can be worse after you sit or stand for a long time.  Treatment of edema includes several components: treatment of the underlying cause (if possible), reducing the amount of salt (sodium) in your diet, and, in many cases, use of a medication called a diuretic to eliminate excess fluid. Using compression stockings and elevating the legs may also be recommended.   You have had labs (blood tests) sent today. We will call you with any significant abnormalities or if there is need to begin or change treatment or pursue further follow up.  You may also review your test results online through MyChart. If you do not have a MyChart account, instructions to sign up should be on your discharge paperwork.      Reviewed expectations re: course of current medical issues. Questions answered. Outlined signs and symptoms  indicating need for more acute intervention. Understanding verbalized. After Visit Summary given.   SUBJECTIVE: History from: Patient. Danielle Frost is a 53 y.o. female. I think it's my blood pressure - Entered by patient  Pt presents c/o leg swelling in both legs x 2 days. Pt states her legs feel tight and it's hard to stand after a period of time. Pt denies any additional sxs.  Denies recent immobility or prolonged travel. Denies: fever and difficulty breathing and CP. Normal PO intake without n/v/d.  OBJECTIVE:  Vitals:   02/16/24 1359 02/16/24 1400  BP:  (!) 143/87  Pulse:  79  Resp:  18  Temp:  98.1 F (36.7 C)  TempSrc:  Oral  SpO2:  95%  Weight: 103 kg     General appearance: alert; no distress Eyes: PERRLA; EOMI; conjunctiva normal HENT: ; AT; without nasal congestion Neck: supple  Lungs: speaks full sentences without difficulty; unlabored; CTAB CV: RRR Extremities: bilateral 2+ pitting edema Skin: warm and dry Neurologic: normal gait Psychological: alert and cooperative; normal mood and affect  Labs: Results for orders placed or performed during the hospital encounter of 02/16/24  POCT URINE DIPSTICK   Collection Time: 02/16/24  2:28 PM  Result Value Ref Range   Color, UA yellow yellow   Clarity, UA clear clear   Glucose, UA negative negative mg/dL   Bilirubin, UA negative negative   Ketones, POC UA negative negative mg/dL   Spec Grav, UA 8.974 8.989 - 1.025   Blood, UA negative negative   pH, UA 6.0 5.0 - 8.0   POC PROTEIN,UA negative negative, trace   Urobilinogen, UA 4.0 (A) 0.2 or 1.0 E.U./dL   Nitrite, UA Negative Negative   Leukocytes, UA Negative Negative  CBC   Collection Time: 02/16/24  2:49 PM  Result Value Ref Range   WBC 4.3 3.4 - 10.8 x10E3/uL   RBC 4.17 3.77 - 5.28 x10E6/uL   Hemoglobin 12.0 11.1 - 15.9 g/dL   Hematocrit 63.3 65.9 - 46.6 %   MCV 88 79 - 97 fL   MCH 28.8 26.6 - 33.0 pg   MCHC 32.8 31.5 - 35.7 g/dL   RDW 86.2  88.2 - 84.5 %   Platelets 258 150 - 450 x10E3/uL  Comprehensive metabolic panel with GFR   Collection Time: 02/16/24  2:49 PM  Result Value Ref Range   Glucose 124 (H)  70 - 99 mg/dL   BUN 10 6 - 24 mg/dL   Creatinine, Ser 9.16 0.57 - 1.00 mg/dL   eGFR 84 >40 fO/fpw/8.26   BUN/Creatinine Ratio 12 9 - 23   Sodium 141 134 - 144 mmol/L   Potassium 3.8 3.5 - 5.2 mmol/L   Chloride 105 96 - 106 mmol/L   CO2 20 20 - 29 mmol/L   Calcium 9.3 8.7 - 10.2 mg/dL   Total Protein 7.0 6.0 - 8.5 g/dL   Albumin 3.9 3.8 - 4.9 g/dL   Globulin, Total 3.1 1.5 - 4.5 g/dL   Bilirubin Total 0.2 0.0 - 1.2 mg/dL   Alkaline Phosphatase 97 44 - 121 IU/L   AST 16 0 - 40 IU/L   ALT 22 0 - 32 IU/L  Brain natriuretic peptide   Collection Time: 02/16/24  2:49 PM  Result Value Ref Range   BNP 31.6 0.0 - 100.0 pg/mL   Labs Reviewed  COMPREHENSIVE METABOLIC PANEL WITH GFR - Abnormal; Notable for the following components:      Result Value   Glucose 124 (*)    All other components within normal limits   Narrative:    Performed at:  [address] [facility] [address], [place], [place]  [id number] Lab Director: Frankey Sas MD, Phone:  [phone]  POCT URINE DIPSTICK - Abnormal; Notable for the following components:   Urobilinogen, UA 4.0 (*)    All other components within normal limits  CBC   Narrative:    Performed at:  [address] [facility] [address], [place], [place]  [id number] Lab Director: Frankey Sas MD, Phone:  [phone]  BRAIN NATRIURETIC PEPTIDE   Narrative:    Performed at:  [address] [facility] [address], [place], [place]  [id number] Lab Director: Frankey Sas MD, Phone:  [phone]    Imaging: No results found.  No Known Allergies  Past Medical History:  Diagnosis Date   GERD (gastroesophageal reflux disease)    Strep pharyngitis    Social History   Socioeconomic History   Marital status: Single    Spouse name: Not on file   Number of children: Not on  file   Years of education: Not on file   Highest education level: Not on file  Occupational History   Not on file  Tobacco Use   Smoking status: Never    Passive exposure: Never   Smokeless tobacco: Never  Vaping Use   Vaping status: Some Days   Substances: Nicotine  Substance and Sexual Activity   Alcohol use: No    Comment: occasional   Drug use: No   Sexual activity: Yes    Birth control/protection: Surgical  Other Topics Concern   Not on file  Social History Narrative   Not on file   Social Drivers of Health   Financial Resource Strain: Not on file  Food Insecurity: Not on file  Transportation Needs: Not on file  Physical Activity: Not on file  Stress: Not on file  Social Connections: Not on file  Intimate Partner Violence: Not on file   History reviewed. No pertinent family history. Past Surgical History:  Procedure Laterality Date   APPENDECTOMY     TUBAL LIGATION       Rolinda Rogue, MD 02/21/24 [phone]

## 2024-04-30 ENCOUNTER — Ambulatory Visit
Admission: EM | Admit: 2024-04-30 | Discharge: 2024-04-30 | Disposition: A | Discharge status: Home / Self Care | Payer: Self-pay | Attending: Nurse Practitioner | Admitting: Nurse Practitioner

## 2024-04-30 ENCOUNTER — Encounter: Payer: Self-pay | Admitting: Emergency Medicine

## 2024-04-30 DIAGNOSIS — T148XXA Other injury of unspecified body region, initial encounter: Secondary | ICD-10-CM | POA: Insufficient documentation

## 2024-04-30 DIAGNOSIS — L089 Local infection of the skin and subcutaneous tissue, unspecified: Secondary | ICD-10-CM | POA: Insufficient documentation

## 2024-04-30 DIAGNOSIS — W57XXXA Bitten or stung by nonvenomous insect and other nonvenomous arthropods, initial encounter: Secondary | ICD-10-CM | POA: Insufficient documentation

## 2024-04-30 DIAGNOSIS — S30861A Insect bite (nonvenomous) of abdominal wall, initial encounter: Secondary | ICD-10-CM | POA: Insufficient documentation

## 2024-04-30 DIAGNOSIS — L03311 Cellulitis of abdominal wall: Secondary | ICD-10-CM | POA: Insufficient documentation

## 2024-04-30 MED ORDER — IBUPROFEN 800 MG PO TABS: 800.0000 mg | ORAL_TABLET | Freq: Three times a day (TID) | ORAL | 0 refills | Status: AC | PRN

## 2024-04-30 MED ORDER — CEFTRIAXONE SODIUM 1 G IJ SOLR
1.0000 g | Freq: Once | INTRAMUSCULAR | Status: AC
  Administered 2024-04-30: 1 g via INTRAMUSCULAR

## 2024-04-30 MED ORDER — SULFAMETHOXAZOLE-TRIMETHOPRIM 800-160 MG PO TABS: 1.0000 | ORAL_TABLET | Freq: Two times a day (BID) | ORAL | 0 refills | Status: AC

## 2024-04-30 NOTE — ED Provider Notes (Signed)
 [facility]    CSN: [id number] Arrival date & time: 04/30/24  0844      History   Chief Complaint Chief Complaint  Patient presents with   Insect Bite    HPI Danielle Frost is a 53 y.o. female.   Discussed the use of AI scribe software for clinical note transcription with the patient, who gave verbal consent to proceed.   The patient presents with a lesion on the left abdomen that began approximately six days ago, initially resembling a possible insect or spider bite. She reports that the area started as a red spot, which has since spread. The lesion began bleeding yesterday after her niece applied antibiotic ointment and a bandage. The patient describes the affected area as burning or sunburn-like, especially when irritated by clothing such as pants or a shirt rubbing against it. She denies fever, chills, or body aches. No known allergies to antibiotics are reported.  The following sections of the patient's history were reviewed and updated as appropriate: allergies, current medications, past family history, past medical history, past social history, past surgical history, and problem list.     Past Medical History:  Diagnosis Date   GERD (gastroesophageal reflux disease)    Strep pharyngitis     There are no active problems to display for this patient.   Past Surgical History:  Procedure Laterality Date   APPENDECTOMY     TUBAL LIGATION      OB History     Gravida  3   Para  3   Term  3   Preterm      AB      Living  3      SAB      IAB      Ectopic      Multiple      Live Births               Home Medications    Prior to Admission medications   Medication Sig Start Date End Date Taking? Authorizing Provider  ibuprofen  (ADVIL ) 800 MG tablet Take 1 tablet (800 mg total) by mouth every 8 (eight) hours as needed (pain). Take with food to avoid stomach upset. Do not take any additional NSAIDs while on this. You may take  tylenol  in addition to this if needed for extra pain relief. 04/30/24  Yes Shirelle Tootle, FNP  sulfamethoxazole -trimethoprim  (BACTRIM  DS) 800-160 MG tablet Take 1 tablet by mouth 2 (two) times daily for 7 days. 04/30/24 05/07/24 Yes Iola Lukes, FNP  furosemide  (LASIX ) 20 MG tablet Take 1-2 tablets in the morning as needed for leg swelling. 02/16/24   Rolinda Rogue, MD    Family History No family history on file.  Social History Social History   Tobacco Use   Smoking status: Never    Passive exposure: Never   Smokeless tobacco: Never  Vaping Use   Vaping status: Some Days   Substances: Nicotine  Substance Use Topics   Alcohol use: No    Comment: occasional   Drug use: No     Allergies   Patient has no known allergies.   Review of Systems Review of Systems  Constitutional:  Negative for chills and fever.  Musculoskeletal:  Negative for myalgias.  Skin:  Positive for wound.  All other systems reviewed and are negative.    Physical Exam Triage Vital Signs ED Triage Vitals  Encounter Vitals Group     BP 04/30/24 0904 124/87  Girls Systolic BP Percentile --      Girls Diastolic BP Percentile --      Boys Systolic BP Percentile --      Boys Diastolic BP Percentile --      Pulse Rate 04/30/24 0904 88     Resp 04/30/24 0904 18     Temp 04/30/24 0904 97.8 F (36.6 C)     Temp Source 04/30/24 0904 Oral     SpO2 04/30/24 0904 95 %     Weight 04/30/24 0905 210 lb (95.3 kg)     Height 04/30/24 0905 5' 5 (1.651 m)     Head Circumference --      Peak Flow --      Pain Score 04/30/24 0905 4     Pain Loc --      Pain Education --      Exclude from Growth Chart --    No data found.  Updated Vital Signs BP 124/87 (BP Location: Left Arm)   Pulse 88   Temp 97.8 F (36.6 C) (Oral)   Resp 18   Ht 5' 5 (1.651 m)   Wt 210 lb (95.3 kg)   LMP 08/20/2015 (Within Days)   SpO2 95%   BMI 34.95 kg/m   Visual Acuity Right Eye Distance:   Left Eye Distance:    Bilateral Distance:    Right Eye Near:   Left Eye Near:    Bilateral Near:     Physical Exam Vitals reviewed.  Constitutional:      General: She is awake. She is not in acute distress.    Appearance: Normal appearance. She is well-developed. She is not ill-appearing, toxic-appearing or diaphoretic.  HENT:     Head: Normocephalic.     Right Ear: Hearing normal.     Left Ear: Hearing normal.     Nose: Nose normal.     Mouth/Throat:     Mouth: Mucous membranes are moist.  Eyes:     General: Vision grossly intact.     Conjunctiva/sclera: Conjunctivae normal.  Cardiovascular:     Rate and Rhythm: Normal rate and regular rhythm.     Heart sounds: Normal heart sounds.  Pulmonary:     Effort: Pulmonary effort is normal.     Breath sounds: Normal breath sounds and air entry.  Abdominal:   Musculoskeletal:        General: Normal range of motion.     Cervical back: Normal range of motion and neck supple.  Skin:    General: Skin is warm and dry.     Findings: Wound present.     Comments: See description above and picture below   Neurological:     General: No focal deficit present.     Mental Status: She is alert and oriented to person, place, and time.  Psychiatric:        Speech: Speech normal.        Behavior: Behavior is cooperative.         UC Treatments / Results  Labs (all labs ordered are listed, but only abnormal results are displayed) Labs Reviewed - No data to display  EKG   Radiology No results found.  Procedures Incision and Drainage  Date/Time: 04/30/2024 9:41 AM  Performed by: Iola Lukes, FNP Authorized by: Iola Lukes, FNP   Consent:    Consent obtained:  Verbal   Consent given by:  Patient   Risks, benefits, and alternatives were discussed: yes     Risks discussed:  Bleeding, pain and infection Universal protocol:    Patient identity confirmed:  Verbally with patient and arm band Location:    Type:  Abscess   Location:   Trunk   Trunk location:  Abdomen Pre-procedure details:    Skin preparation:  Povidone-iodine Sedation:    Sedation type:  None Anesthesia:    Anesthesia method:  Local infiltration   Local anesthetic:  Lidocaine 1% w/o epi Procedure type:    Complexity:  Simple Procedure details:    Incision types:  Single straight (A small 0.5 cm incision was made within the wound to facilitate drainage)   Drainage:  Purulent   Drainage amount:  Scant   Wound treatment:  Wound left open Post-procedure details:    Procedure completion:  Tolerated well, no immediate complications  (including critical care time)  Medications Ordered in UC Medications  cefTRIAXone  (ROCEPHIN ) injection 1 g (1 g Intramuscular Given 04/30/24 1001)    Initial Impression / Assessment and Plan / UC Course  I have reviewed the triage vital signs and the nursing notes.  Pertinent labs & imaging results that were available during my care of the patient were reviewed by me and considered in my medical decision making (see chart for details).     Patient presents with a 6-day history of a wound on the left abdomen, suspected to be from an insect or spider bite, now showing signs of localized infection with surrounding erythema consistent with cellulitis. The wound has worsened, with bleeding noted after application of ointment and a bandage. No systemic symptoms such as fever, chills, or body aches are reported. Examination reveals a firm area with purulent drainage expressed manually, without evidence of deeper tissue involvement. Under local anesthesia, small incision was made for drainage, and a wound culture was obtained to identify the causative organism and guide antibiotic therapy. The patient was treated with an intramuscular Rocephin  injection in clinic and started on Bactrim  DS BID for 7 days. Motrin  800 mg TID prescribed for pain; patient advised to avoid other NSAIDs and may use Tylenol  for additional relief. Patient  instructed to apply warm compresses, cleanse gently with mild soap and water, and avoid manipulating or covering the wound. Advised to follow up with PCP if symptoms do not improve and to return to urgent care or the emergency department if redness spreads, drainage increases, or systemic symptoms develop.  Today's evaluation has revealed no signs of a dangerous process. Discussed diagnosis with patient and/or guardian. Patient and/or guardian aware of their diagnosis, possible red flag symptoms to watch out for and need for close follow up. Patient and/or guardian understands verbal and written discharge instructions. Patient and/or guardian comfortable with plan and disposition.  Patient and/or guardian has a clear mental status at this time, good insight into illness (after discussion and teaching) and has clear judgment to make decisions regarding their care  Documentation was completed with the aid of voice recognition software. Transcription may contain typographical errors.  Final Clinical Impressions(s) / UC Diagnoses   Final diagnoses:  Insect bite (nonvenomous) of abdominal wall, initial encounter  Wound infection  Cellulitis of abdominal wall     Discharge Instructions      You were seen today for an infected wound on your left abdomen, likely caused by an insect or spider bite. The wound showed signs of infection, including redness and drainage. The area was cleaned, and a small incision was made to help drain the infection. A wound culture was collected to identify  the bacteria and make sure the correct antibiotic is being used. You received an antibiotic injection in the clinic today and have been prescribed Bactrim  DS to take twice daily for 7 days. It is very important to take the full course of antibiotics, even if the area starts to look better. For pain, you may take Motrin  800 mg three times a day as needed, but do not take any additional over-the-counter NSAIDs such as  ibuprofen  or naproxen. If needed, you may take Tylenol  (acetaminophen ) 1000 mg every six hours for additional pain relief. This equals two 500 mg tablets at a time. Be careful not to take more than 4000 mg of Tylenol  in a 24-hour period. Keep the area clean by washing gently with mild soap and water, then patting it dry. Avoid using ointments, lotions, or covering the wound with a bandage unless instructed otherwise. You can apply a warm compress (such as a warm, damp washcloth) to the area for 10-15 minutes several times a day to help with healing and comfort. Do not squeeze, mash, or pick at the wound. Follow up with your primary care provider if the area does not start improving within a few days or if you have any concerns. Go to the emergency department right away if you develop a fever, chills, worsening redness or swelling, increased drainage, spreading pain, or if the area becomes very tender or you start feeling generally unwell.     ED Prescriptions     Medication Sig Dispense Auth. Provider   sulfamethoxazole -trimethoprim  (BACTRIM  DS) 800-160 MG tablet Take 1 tablet by mouth 2 (two) times daily for 7 days. 14 tablet Skyleen Bentley, [place], FNP   ibuprofen  (ADVIL ) 800 MG tablet Take 1 tablet (800 mg total) by mouth every 8 (eight) hours as needed (pain). Take with food to avoid stomach upset. Do not take any additional NSAIDs while on this. You may take tylenol  in addition to this if needed for extra pain relief. 21 tablet Iola Lukes, FNP      PDMP not reviewed this encounter.   Iola Lukes, FNP 04/30/24 1057

## 2024-04-30 NOTE — ED Triage Notes (Signed)
 Pt st's she woke up 6 days ago and noticed some type of insect bite on her left abd.  Pt st's the area has gotten bigger and is now draining

## 2024-04-30 NOTE — Discharge Instructions (Addendum)
 You were seen today for an infected wound on your left abdomen, likely caused by an insect or spider bite. The wound showed signs of infection, including redness and drainage. The area was cleaned, and a small incision was made to help drain the infection. A wound culture was collected to identify the bacteria and make sure the correct antibiotic is being used. You received an antibiotic injection in the clinic today and have been prescribed Bactrim  DS to take twice daily for 7 days. It is very important to take the full course of antibiotics, even if the area starts to look better. For pain, you may take Motrin  800 mg three times a day as needed, but do not take any additional over-the-counter NSAIDs such as ibuprofen  or naproxen. If needed, you may take Tylenol  (acetaminophen ) 1000 mg every six hours for additional pain relief. This equals two 500 mg tablets at a time. Be careful not to take more than 4000 mg of Tylenol  in a 24-hour period. Keep the area clean by washing gently with mild soap and water, then patting it dry. Avoid using ointments, lotions, or covering the wound with a bandage unless instructed otherwise. You can apply a warm compress (such as a warm, damp washcloth) to the area for 10-15 minutes several times a day to help with healing and comfort. Do not squeeze, mash, or pick at the wound. Follow up with your primary care provider if the area does not start improving within a few days or if you have any concerns. Go to the emergency department right away if you develop a fever, chills, worsening redness or swelling, increased drainage, spreading pain, or if the area becomes very tender or you start feeling generally unwell.

## 2024-05-02 ENCOUNTER — Ambulatory Visit (HOSPITAL_COMMUNITY): Payer: Self-pay

## 2024-05-02 LAB — AEROBIC CULTURE W GRAM STAIN (SUPERFICIAL SPECIMEN): Gram Stain: NONE SEEN

## 2024-05-02 NOTE — Progress Notes (Signed)
 Wound culture returned positive for bacterial growth. The identified organism is sensitive to the antibiotic that was prescribed. Patient should complete the full course of antibiotics to ensure adequate treatment and resolution of infection.
